# Patient Record
Sex: Female | Born: 1945
Health system: Southern US, Community
[De-identification: ages and names within clinical notes are randomized; demographics above are authoritative.]

## PROBLEM LIST (undated history)

## (undated) DIAGNOSIS — L02619 Cutaneous abscess of unspecified foot: Secondary | ICD-10-CM

## (undated) DIAGNOSIS — I10 Essential (primary) hypertension: Secondary | ICD-10-CM

## (undated) DIAGNOSIS — L600 Ingrowing nail: Secondary | ICD-10-CM

## (undated) DIAGNOSIS — E785 Hyperlipidemia, unspecified: Secondary | ICD-10-CM

## (undated) HISTORY — PX: TONSILLECTOMY: SUR1361

## (undated) HISTORY — PX: CHOLECYSTECTOMY: SHX55

## (undated) HISTORY — DX: Essential (primary) hypertension: I10

## (undated) HISTORY — DX: Hyperlipidemia, unspecified: E78.5

## (undated) HISTORY — PX: BREAST BIOPSY: SHX20

## (undated) HISTORY — PX: OTHER SURGICAL HISTORY: SHX169

---
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
## 2017-04-02 ENCOUNTER — Other Ambulatory Visit: Payer: Self-pay | Admitting: Physician Assistant

## 2017-04-02 DIAGNOSIS — Z1231 Encounter for screening mammogram for malignant neoplasm of breast: Secondary | ICD-10-CM

## 2017-04-10 ENCOUNTER — Ambulatory Visit
Admission: RE | Admit: 2017-04-10 | Discharge: 2017-04-10 | Disposition: A | Discharge status: Home / Self Care | Payer: Medicare Other | Source: Ambulatory Visit | Attending: Physician Assistant | Admitting: Physician Assistant

## 2017-04-10 DIAGNOSIS — Z1231 Encounter for screening mammogram for malignant neoplasm of breast: Secondary | ICD-10-CM

## 2017-04-14 ENCOUNTER — Other Ambulatory Visit: Payer: Self-pay | Admitting: Physician Assistant

## 2017-04-14 DIAGNOSIS — R928 Other abnormal and inconclusive findings on diagnostic imaging of breast: Secondary | ICD-10-CM

## 2017-04-16 ENCOUNTER — Ambulatory Visit: Payer: Medicare Other

## 2017-04-16 ENCOUNTER — Ambulatory Visit
Admission: RE | Admit: 2017-04-16 | Discharge: 2017-04-16 | Disposition: A | Discharge status: Home / Self Care | Payer: Medicare Other | Source: Ambulatory Visit | Attending: Physician Assistant | Admitting: Physician Assistant

## 2017-04-16 DIAGNOSIS — R928 Other abnormal and inconclusive findings on diagnostic imaging of breast: Secondary | ICD-10-CM

## 2017-06-05 ENCOUNTER — Encounter (INDEPENDENT_AMBULATORY_CARE_PROVIDER_SITE_OTHER): Payer: Self-pay

## 2018-03-15 ENCOUNTER — Other Ambulatory Visit: Payer: Self-pay | Admitting: Family Medicine

## 2018-03-15 DIAGNOSIS — Z1231 Encounter for screening mammogram for malignant neoplasm of breast: Secondary | ICD-10-CM

## 2018-04-26 ENCOUNTER — Ambulatory Visit
Admission: RE | Admit: 2018-04-26 | Discharge: 2018-04-26 | Disposition: A | Discharge status: Home / Self Care | Payer: Medicare Other | Source: Ambulatory Visit | Attending: Family Medicine | Admitting: Family Medicine

## 2018-04-26 DIAGNOSIS — Z1231 Encounter for screening mammogram for malignant neoplasm of breast: Secondary | ICD-10-CM

## 2019-05-18 ENCOUNTER — Other Ambulatory Visit: Payer: Self-pay

## 2019-05-18 ENCOUNTER — Ambulatory Visit (INDEPENDENT_AMBULATORY_CARE_PROVIDER_SITE_OTHER): Payer: Medicare Other | Admitting: Podiatry

## 2019-05-18 ENCOUNTER — Encounter: Payer: Self-pay | Admitting: Podiatry

## 2019-05-18 VITALS — BP 144/71 | HR 52

## 2019-05-18 DIAGNOSIS — L02619 Cutaneous abscess of unspecified foot: Secondary | ICD-10-CM

## 2019-05-18 DIAGNOSIS — B351 Tinea unguium: Secondary | ICD-10-CM

## 2019-05-18 DIAGNOSIS — L600 Ingrowing nail: Secondary | ICD-10-CM | POA: Insufficient documentation

## 2019-05-18 DIAGNOSIS — M79674 Pain in right toe(s): Secondary | ICD-10-CM

## 2019-05-18 DIAGNOSIS — L03039 Cellulitis of unspecified toe: Secondary | ICD-10-CM

## 2019-05-18 DIAGNOSIS — L03031 Cellulitis of right toe: Secondary | ICD-10-CM | POA: Diagnosis not present

## 2019-05-18 DIAGNOSIS — M79675 Pain in left toe(s): Secondary | ICD-10-CM | POA: Diagnosis not present

## 2019-05-18 DIAGNOSIS — L02611 Cutaneous abscess of right foot: Secondary | ICD-10-CM

## 2019-05-18 HISTORY — DX: Cellulitis of unspecified toe: L03.039

## 2019-05-18 HISTORY — DX: Ingrowing nail: L60.0

## 2019-05-18 HISTORY — DX: Cutaneous abscess of unspecified foot: L02.619

## 2019-05-18 MED ORDER — AMOXICILLIN 500 MG PO CAPS: 500.0000 mg | ORAL_CAPSULE | Freq: Two times a day (BID) | ORAL | 0 refills | Status: DC

## 2019-05-18 NOTE — Patient Instructions (Addendum)
EPSOM SALT FOOT SOAK INSTRUCTIONS  1.  Place 1/4 cup of epsom salts in 2 quarts of warm tap water. IF YOU ARE DIABETIC, OR HAVE NEUROPATHY,  CHECK THE TEMPERATURE OF THE WATER WITH YOUR ELBOW.  2.  Submerge your foot/feet in the solution and soak for 20 minutes.      3.  Next, remove your foot or feet from solution, blot dry the affected area.    4.  Apply antibiotic ointment and cover with fabric band-aid .  5.  This soak should be done once a day for 10 days.   6.  Monitor for any signs/symptoms of infection such as redness, swelling, odor, drainage, increased pain,  or non-healing of digit.   7.  Please do not hesitate to call the office and speak to a Nurse or Doctor if you have questions.   8.  If you experience fever, chills, nightsweats, nausea or vomiting with worsening of digit, please go to the emergency room.     Ingrown Toenail An ingrown toenail occurs when the corner or sides of a toenail grow into the surrounding skin. This causes discomfort and pain. The big toe is most commonly affected, but any of the toes can be affected. If an ingrown toenail is not treated, it can become infected. What are the causes? This condition may be caused by:  Wearing shoes that are too small or tight.  An injury, such as stubbing your toe or having your toe stepped on.  Improper cutting or care of your toenails.  Having nail or foot abnormalities that were present from birth (congenital abnormalities), such as having a nail that is too big for your toe. What increases the risk? The following factors may make you more likely to develop ingrown toenails:  Age. Nails tend to get thicker with age, so ingrown nails are more common among older people.  Cutting your toenails incorrectly, such as cutting them very short or cutting them unevenly. An ingrown toenail is more likely to get infected if you have:  Diabetes.  Blood flow (circulation) problems. What are the signs or symptoms?  Symptoms of an ingrown toenail may include:  Pain, soreness, or tenderness.  Redness.  Swelling.  Hardening of the skin that surrounds the toenail. Signs that an ingrown toenail may be infected include:  Fluid or pus.  Symptoms that get worse instead of better. How is this diagnosed? An ingrown toenail may be diagnosed based on your medical history, your symptoms, and a physical exam. If you have fluid or blood coming from your toenail, a sample may be collected to test for the specific type of bacteria that is causing the infection. How is this treated? Treatment depends on how severe your ingrown toenail is. You may be able to care for your toenail at home.  If you have an infection, you may be prescribed antibiotic medicines.  If you have fluid or pus draining from your toenail, your health care provider may drain it.  If you have trouble walking, you may be given crutches to use.  If you have a severe or infected ingrown toenail, you may need a procedure to remove part or all of the nail. Follow these instructions at home: Foot care   Do not pick at your toenail or try to remove it yourself.  Soak your foot in warm, soapy water. Do this for 20 minutes, 3 times a day, or as often as told by your health care provider. This helps to   keep your toe clean and keep your skin soft.  Wear shoes that fit well and are not too tight. Your health care provider may recommend that you wear open-toed shoes while you heal.  Trim your toenails regularly and carefully. Cut your toenails straight across to prevent injury to the skin at the corners of the toenail. Do not cut your nails in a curved shape.  Keep your feet clean and dry to help prevent infection. Medicines  Take over-the-counter and prescription medicines only as told by your health care provider.  If you were prescribed an antibiotic, take it as told by your health care provider. Do not stop taking the antibiotic even if you  start to feel better. Activity  Return to your normal activities as told by your health care provider. Ask your health care provider what activities are safe for you.  Avoid activities that cause pain. General instructions  If your health care provider told you to use crutches to help you move around, use them as instructed.  Keep all follow-up visits as told by your health care provider. This is important. Contact a health care provider if:  You have more redness, swelling, pain, or other symptoms that do not improve with treatment.  You have fluid, blood, or pus coming from your toenail. Get help right away if:  You have a red streak on your skin that starts at your foot and spreads up your leg.  You have a fever. Summary  An ingrown toenail occurs when the corner or sides of a toenail grow into the surrounding skin. This causes discomfort and pain. The big toe is most commonly affected, but any of the toes can be affected.  If an ingrown toenail is not treated, it can become infected.  Fluid or pus draining from your toenail is a sign of infection. Your health care provider may need to drain it. You may be given antibiotics to treat the infection.  Trimming your toenails regularly and properly can help you prevent an ingrown toenail. This information is not intended to replace advice given to you by your health care provider. Make sure you discuss any questions you have with your health care provider. Document Released: 08/15/2000 Document Revised: 12/10/2018 Document Reviewed: 05/06/2017 Elsevier Patient Education  2020 [employer] Inc[employer]  

## 2019-05-26 ENCOUNTER — Encounter: Payer: Self-pay | Admitting: Podiatry

## 2019-05-26 NOTE — Progress Notes (Signed)
Subjective: Amber Goodwin presents today with cc of painful right great toe for the past 10 days. She suspects it is an ingrown toenail. She has not been able to wear enclosed shoe gear due to pain.     Past Medical History:  Diagnosis Date  . HTN (hypertension)   . Hyperlipidemia    Outpatient Medications Prior to Visit  Medication Sig Dispense Refill  . atorvastatin (LIPITOR) 10 MG tablet     . hydrochlorothiazide (HYDRODIURIL) 25 MG tablet     . hydrocortisone 2.5 % cream apply TO ear(s) 2 TIMES DAILY FOR 2 WEEKS AS NEEDED FOR flares    . ketoconazole (NIZORAL) 2 % cream apply thin layer TO ears EVERY DAY FOR maintenance    . metoprolol succinate (TOPROL-XL) 100 MG 24 hr tablet     . potassium chloride SA (K-DUR) 20 MEQ tablet      No facility-administered medications prior to visit.    No Known Allergies  Past Surgical History:  Procedure Laterality Date  . BREAST BIOPSY    . Bundle tie release right wrist    . CESAREAN SECTION     x 2  . CHOLECYSTECTOMY    . TONSILLECTOMY     Social History   Occupational History  . Not on file  Tobacco Use  . Smoking status: Never Smoker  . Smokeless tobacco: Never Used  Substance and Sexual Activity  . Alcohol use: Yes    Comment: occasionally  . Drug use: Not on file  . Sexual activity: Not on file     Family History  Problem Relation Age of Onset  . Breast cancer Mother   . Breast cancer Maternal Aunt   . Breast cancer Maternal Grandmother      There is no immunization history on file for this patient.   Review of systems: Positive Findings in bold print.  Constitutional:  chills, fatigue, fever, sweats, weight change Communication: Optometrist, sign Ecologist, hand writing, iPad/Android device Head: headaches, head injury Eyes: changes in vision, eye pain, glaucoma, cataracts, macular degeneration, diplopia, glare,  light sensitivity, eyeglasses or contacts, blindness Ears nose mouth throat: hearing  impaired, hearing aids,  ringing in ears, deaf, sign language,  vertigo,   nosebleeds,  rhinitis,  cold sores, snoring, swollen glands Cardiovascular: HTN, edema, arrhythmia, pacemaker in place, defibrillator in place, chest pain/tightness, chronic anticoagulation, blood clot, heart failure, MI Peripheral Vascular: leg cramps, varicose veins, blood clots, lymphedema, varicosities Respiratory:  difficulty breathing, denies congestion, SOB, wheezing, cough, emphysema Gastrointestinal: change in appetite or weight, abdominal pain, constipation, diarrhea, nausea, vomiting, vomiting blood, change in bowel habits, abdominal pain, jaundice, rectal bleeding, hemorrhoids, GERD Genitourinary:  nocturia,  pain on urination, polyuria,  blood in urine, Foley catheter, urinary urgency, ESRD on hemodialysis Musculoskeletal: amputation, cramping, stiff joints, painful joints, decreased joint motion, fractures, OA, gout, hemiplegia, paraplegia, uses cane, wheelchair bound, uses walker, uses rollator Skin: +changes in toenails, color change, dryness, itching, mole changes,  rash, wound(s) Neurological: headaches, numbness in feet, paresthesias in feet, burning in feet, fainting,  seizures, change in speech. denies headaches, memory problems/poor historian, cerebral palsy, weakness, paralysis, CVA, TIA Endocrine: diabetes, hypothyroidism, hyperthyroidism,  goiter, dry mouth, flushing, heat intolerance,  cold intolerance,  excessive thirst, denies polyuria,  nocturia Hematological:  easy bleeding, excessive bleeding, easy bruising, enlarged lymph nodes, on long term blood thinner, history of past transusions Allergy/immunological:  hives, eczema, frequent infections, multiple drug allergies, seasonal allergies, transplant recipient, multiple food allergies Psychiatric:  anxiety,  depression, mood disorder, suicidal ideations, hallucinations, insomnia  Objective: Vitals:   05/18/19 1634  BP: (!) 144/71  Pulse: (!) 52     Vascular Examination: Capillary refill time immediate x 10 digits.  Dorsalis pedis pulses palpable b/l.   Posterior tibial pulses palpable b/l.   No digital hair x 10 digits.  Skin temperature gradient WNL b/l.  Dermatological Examination: Skin with normal turgor, texture and tone b/l.  Incurvated nailplate right great toe medial border(s) with tenderness to palpation. Mild erythema noted along medial border. No purulence expressed.   Toenails 1-5 b/l discolored, thick, dystrophic with subungual debris and pain with palpation to nailbeds due to thickness of nails.  Musculoskeletal: Muscle strength 5/5 to all LE muscle groups  Neurological: Sensation intact 5/5 b/l with 10 gram monofilament.  Vibratory sensation intact b/l.  Assessment: 1. Cellulitis right great toe 2. Ingrown toenail right great toe 3. Painful onychomycosis toenails 1-5 b/l   Plan: 1. Discussed diagnosis and treatment options.  Literature dispensed on today. 2. Offending nail border debrided and curretaged right hallux. Border cleansed with alcohol and triple antibiotic applied. Patient given written instructions for epsom salt soaks once daily for one week. Call office if condition does not resolve.  3. Rx written for amoxicillin 500 mg po bid x 10 days. 4. Toenails 1-5 left, 2-5 right were debrided in length and girth without iatrogenic bleeding. 5. Patient to continue soft, supportive shoe gear daily. 6. Patient to report any pedal injuries to medical professional immediately. 7. Call if condition worsens. Recommended follow up 9-12 weeks. Patient states she will call and schedule follow up appt as she would like to coordinate with her husband's next appointment.  8. Patient/POA to call should there be a concern in the interim.

## 2019-05-31 ENCOUNTER — Ambulatory Visit
Admission: RE | Admit: 2019-05-31 | Discharge: 2019-05-31 | Disposition: A | Discharge status: Home / Self Care | Payer: Medicare Other | Source: Ambulatory Visit | Attending: Family Medicine | Admitting: Family Medicine

## 2019-05-31 ENCOUNTER — Other Ambulatory Visit: Payer: Self-pay | Admitting: Family Medicine

## 2019-05-31 ENCOUNTER — Other Ambulatory Visit: Payer: Self-pay

## 2019-05-31 DIAGNOSIS — Z1231 Encounter for screening mammogram for malignant neoplasm of breast: Secondary | ICD-10-CM

## 2019-08-08 ENCOUNTER — Ambulatory Visit: Payer: Medicare Other | Admitting: Podiatry

## 2019-08-08 ENCOUNTER — Other Ambulatory Visit: Payer: Self-pay

## 2019-08-08 ENCOUNTER — Encounter: Payer: Self-pay | Admitting: Podiatry

## 2019-08-08 DIAGNOSIS — M79674 Pain in right toe(s): Secondary | ICD-10-CM

## 2019-08-08 DIAGNOSIS — M79675 Pain in left toe(s): Secondary | ICD-10-CM

## 2019-08-08 DIAGNOSIS — B351 Tinea unguium: Secondary | ICD-10-CM | POA: Diagnosis not present

## 2019-08-14 NOTE — Progress Notes (Signed)
Subjective:  Amber Goodwin presents to clinic today with cc of  painful, thick, discolored, elongated toenails  of both feet that become tender and patient cannot cut because of thickness. Pain is aggravated when wearing enclosed shoe gear and relieved with periodic professional debridement.  She relates improvement of painful right great toe.She says she is starting to feel it again. Denies any redness, drainage or swelling.  Medications reviewed in chart.  No Known Allergies   Objective:  Physical Examination:  Vascular Examination: Capillary refill time immediate x 10 digits.  Dorsalis pedis present b/l.  Posterior tibial pulses present b/l.  Digital hair absent b/l.  Skin temperature gradient WNL b/l.  Dermatological Examination: Skin with normal turgor, texture and tone b/l.  No open wounds b/l.  No interdigital macerations noted b/l.  Elongated, thick, discolored brittle toenails with subungual debris and pain on dorsal palpation of nailbeds 1-5 b/l.  Incurvated nailplate right great toe medial border with tenderness to palpation. No erythema, no edema, no drainage noted.  Musculoskeletal Examination: Muscle strength 5/5 to all muscle groups b/l.  No pain, crepitus or joint discomfort with active/passive ROM.  Neurological Examination: Sensation intact 5/5 b/l with 10 gram monofilament.  Vibratory sensation intact b/l.  Proprioceptive sensation intact b/l.  Assessment: Mycotic nail infection with pain 1-5 b/l Ingrown nail right hallux, noninfected  Plan: Toenails 1-5 b/l were debrided in length and girth without iatrogenic laceration. Offending nail border debrided and curretaged right hallux. Border cleansed with alcohol and triple antibiotic applied. She was instructed to apply TAO to digit once daily for one week. 2.  Continue soft, supportive shoe gear daily. 3.  Report any pedal injuries to medical professional. 4.  Follow up 9 weeks. 5.   Patient/POA to call should there be a question/concern in there interim.

## 2019-09-24 ENCOUNTER — Ambulatory Visit: Payer: Medicare Other | Attending: Internal Medicine

## 2019-09-24 DIAGNOSIS — Z23 Encounter for immunization: Secondary | ICD-10-CM

## 2019-09-24 NOTE — Progress Notes (Signed)
   Covid-19 Vaccination Clinic  Name:  Amber Goodwin    MRN: [id number] DOB: October 09, 1945  09/24/2019  Amber Goodwin was observed post Covid-19 immunization for 15 minutes without incidence. She was provided with Vaccine Information Sheet and instruction to access the V-Safe system.   Amber Goodwin was instructed to call 911 with any severe reactions post vaccine: Marland Kitchen Difficulty breathing  . Swelling of your face and throat  . A fast heartbeat  . A bad rash all over your body  . Dizziness and weakness    Immunizations Administered    Name Date Dose VIS Date Route   Pfizer COVID-19 Vaccine 09/24/2019  2:17 PM 0.3 mL 08/12/2019 Intramuscular   Manufacturer: [place]   Lot: [id number]   [place]: [id number]

## 2019-10-15 ENCOUNTER — Ambulatory Visit: Payer: Medicare Other | Attending: Internal Medicine

## 2019-10-15 DIAGNOSIS — Z23 Encounter for immunization: Secondary | ICD-10-CM | POA: Insufficient documentation

## 2019-10-15 NOTE — Progress Notes (Signed)
   Covid-19 Vaccination Clinic  Name:  Amber Goodwin    MRN: [id number] DOB: 02/07/1946  10/15/2019  Ms. Hanlin was observed post Covid-19 immunization for 15 minutes without incidence. She was provided with Vaccine Information Sheet and instruction to access the V-Safe system.   Ms. Gaetz was instructed to call 911 with any severe reactions post vaccine: Marland Kitchen Difficulty breathing  . Swelling of your face and throat  . A fast heartbeat  . A bad rash all over your body  . Dizziness and weakness    Immunizations Administered    Name Date Dose VIS Date Route   Pfizer COVID-19 Vaccine 10/15/2019  1:04 PM 0.3 mL 08/12/2019 Intramuscular   Manufacturer: [place]   Lot: [id number]   [place]: [id number]

## 2019-11-08 ENCOUNTER — Other Ambulatory Visit: Payer: Self-pay

## 2019-11-08 ENCOUNTER — Encounter: Payer: Self-pay | Admitting: Podiatry

## 2019-11-08 ENCOUNTER — Ambulatory Visit: Payer: Medicare Other | Admitting: Podiatry

## 2019-11-08 VITALS — Temp 97.3°F

## 2019-11-08 DIAGNOSIS — M79675 Pain in left toe(s): Secondary | ICD-10-CM

## 2019-11-08 DIAGNOSIS — B351 Tinea unguium: Secondary | ICD-10-CM

## 2019-11-08 DIAGNOSIS — M79674 Pain in right toe(s): Secondary | ICD-10-CM

## 2019-11-08 NOTE — Patient Instructions (Signed)

## 2019-11-14 NOTE — Progress Notes (Signed)
Subjective: Amber Goodwin presents today for follow up of follow up painful ingrown toenails b/l great toes and painful mycotic nails b/l that are difficult to trim. Pain interferes with ambulation. Aggravating factors include wearing enclosed shoe gear. Pain is relieved with periodic professional debridement.   She voices no new pedal problems on today's visit.  No Known Allergies   Objective: Vitals:   11/08/19 1419  Temp: (!) 97.3 F (36.3 C)    Pt 74 y.o. year old Caucasian female, obese,  in NAD. AAO x 3.   Vascular Examination:  Capillary refill time to digits immediate b/l. Palpable DP pulses b/l. Palpable PT pulses b/l. Pedal hair absent b/l Skin temperature gradient within normal limits b/l.  Dermatological Examination: Pedal skin with normal turgor, texture and tone bilaterally. No open wounds bilaterally. Toenails 1-5 b/l elongated, dystrophic, thickened, crumbly with subungual debris and tenderness to dorsal palpation. Incurvated nailplate right great toe medial border. No erythema, no edema, no drainage, no flocculence.  Musculoskeletal: Normal muscle strength 5/5 to all lower extremity muscle groups bilaterally, no gross bony deformities bilaterally and no pain crepitus or joint limitation noted with ROM b/l  Neurological: Protective sensation intact 5/5 intact bilaterally with 10g monofilament b/l  Assessment: 1. Pain due to onychomycosis of toenails of both feet    Plan: -Toenails 1-5 b/l were debrided in length and girth with sterile nail nippers and dremel without iatrogenic bleeding.  -Patient to continue soft, supportive shoe gear daily. -Patient to report any pedal injuries to medical professional immediately. -Offending nail border debrided and curretaged right hallux. Border cleansed with alcohol and triple antibiotic applied. No further treatment required by patient/caregiver. -Patient/POA to call should there be question/concern in the interim.  Return  in about 3 months (around 02/08/2020) for nail trim.

## 2020-01-11 ENCOUNTER — Ambulatory Visit: Payer: Medicare Other | Admitting: Podiatry

## 2020-01-11 ENCOUNTER — Encounter: Payer: Self-pay | Admitting: Podiatry

## 2020-01-11 DIAGNOSIS — B351 Tinea unguium: Secondary | ICD-10-CM | POA: Diagnosis not present

## 2020-01-11 DIAGNOSIS — M79674 Pain in right toe(s): Secondary | ICD-10-CM

## 2020-01-11 DIAGNOSIS — M79675 Pain in left toe(s): Secondary | ICD-10-CM | POA: Diagnosis not present

## 2020-01-11 NOTE — Patient Instructions (Signed)

## 2020-01-19 NOTE — Progress Notes (Signed)
Subjective: Amber Goodwin is a 74 y.o. female patient seen today follow up painful ingrown toenails b/l great toes and painful mycotic nails b/l that are difficult to trim. Pain interferes with ambulation. Aggravating factors include wearing enclosed shoe gear. Pain is relieved with periodic professional debridement.  She voices no new medical problems.  Patient Active Problem List   Diagnosis Date Noted  . Ingrown nail of great toe of right foot 05/18/2019    Current Outpatient Medications on File Prior to Visit  Medication Sig Dispense Refill  . atorvastatin (LIPITOR) 10 MG tablet     . hydrochlorothiazide (HYDRODIURIL) 25 MG tablet     . ketoconazole (NIZORAL) 2 % cream apply thin layer TO ears EVERY DAY FOR maintenance    . metoprolol succinate (TOPROL-XL) 100 MG 24 hr tablet     . potassium chloride SA (K-DUR) 20 MEQ tablet     . XIIDRA 5 % SOLN     . amoxicillin (AMOXIL) 500 MG capsule Take 1 capsule (500 mg total) by mouth 2 (two) times daily. 20 capsule 0  . hydrocortisone 2.5 % cream apply TO ear(s) 2 TIMES DAILY FOR 2 WEEKS AS NEEDED FOR flares     No current facility-administered medications on file prior to visit.    No Known Allergies  Objective: Physical Exam  General: Amber Goodwin is a pleasant 74 y.o. Caucasian female, in NAD. AAO x 3.   Vascular:  Neurovascular status unchanged b/l. Capillary refill time to digits immediate b/l. Palpable DP pulses b/l. Palpable PT pulses b/l. Pedal hair absent b/l Skin temperature gradient within normal limits b/l.  Dermatological:  Pedal skin with normal turgor, texture and tone bilaterally. No open wounds bilaterally. No interdigital macerations bilaterally. Toenails 1-5 b/l elongated, dystrophic, thickened, crumbly with subungual debris and tenderness to dorsal palpation.  Incurvated nailplate b/l great toes with tenderness to palpation. No erythema, no edema, no drainage noted.  Musculoskeletal:  Normal muscle strength  5/5 to all lower extremity muscle groups bilaterally. No gross bony deformities bilaterally. No pain crepitus or joint limitation noted with ROM b/l.  Neurological:  Protective sensation intact 5/5 intact bilaterally with 10g monofilament b/l. Vibratory sensation intact b/l. Proprioception intact bilaterally.  Assessment and Plan:  1. Pain due to onychomycosis of toenails of both feet   -Examined patient. -No new findings. No new orders. -Toenails 1-5 b/l were debrided in length and girth with sterile nail nippers and dremel without iatrogenic bleeding. Offending nail borders debrided and curretaged b/l great toes. Borders cleansed with alcohol. Antibiotic ointment applied. No further treatment required by patient. -Patient to continue soft, supportive shoe gear daily. -Patient to report any pedal injuries to medical professional immediately. -Patient/POA to call should there be question/concern in the interim.  Return in about 9 weeks (around 03/14/2020) for ingrown toenail right/ nail trim.  Marzetta Board, DPM

## 2020-03-12 ENCOUNTER — Encounter: Payer: Self-pay | Admitting: Podiatry

## 2020-03-12 ENCOUNTER — Telehealth: Payer: Self-pay | Admitting: Podiatry

## 2020-03-12 ENCOUNTER — Other Ambulatory Visit: Payer: Self-pay

## 2020-03-12 ENCOUNTER — Ambulatory Visit: Payer: Medicare Other | Admitting: Podiatry

## 2020-03-12 DIAGNOSIS — M79674 Pain in right toe(s): Secondary | ICD-10-CM | POA: Diagnosis not present

## 2020-03-12 DIAGNOSIS — M79675 Pain in left toe(s): Secondary | ICD-10-CM | POA: Diagnosis not present

## 2020-03-12 DIAGNOSIS — L600 Ingrowing nail: Secondary | ICD-10-CM | POA: Diagnosis not present

## 2020-03-12 DIAGNOSIS — B351 Tinea unguium: Secondary | ICD-10-CM

## 2020-03-12 MED ORDER — NEOMYCIN-POLYMYXIN-HC 3.5-10000-1 OT SOLN: OTIC | 0 refills | Status: DC

## 2020-03-12 NOTE — Telephone Encounter (Signed)
pts husband called about pts prescription that was sent in. He said they got the prescription and states it says its an ear solution. I tried to transfer to the nurse put pts husband stated he cannot wait on the nurse to call back within 24 hrs. He then asked me to read the medication that was called in and I did and he confirmed it was the correct name as what was called in. The instructions were correct as well.

## 2020-03-12 NOTE — Patient Instructions (Addendum)
EPSOM SALT FOOT SOAK INSTRUCTIONS  *IF YOU HAVE BEEN PRESCRIBED ANTIBIOTICS, TAKE AS INSTRUCTED UNTIL ALL ARE GONE*  Shopping List:  A. Plain epsom salt (not scented) B. Neosporin Cream/Ointment or Bacitracin Cream/Ointment (or prescribed antiobiotic drops/cream/ointment) C. 1-inch fabric band-aids  1.  Place 1/4 cup of epsom salts in 2 quarts of warm tap water. IF YOU ARE DIABETIC, OR HAVE NEUROPATHY, CHECK THE TEMPERATURE OF THE WATER WITH YOUR ELBOW.  2.  Submerge your foot/feet in the solution and soak for 10-15 minutes.      3.  Next, remove your foot/feet from solution, blot dry the affected area.    4.  Apply light amount of antibiotic drops and cover with fabric band-aid.  5.  This soak should be done once a day for 14 days.   6.  Monitor for any signs/symptoms of infection such as redness, swelling, odor, drainage, increased pain, or non-healing of digit.   7.  Please do not hesitate to call the office and speak to a Nurse or Doctor if you have questions.   8.  If you experience fever, chills, nightsweats, nausea or vomiting with worsening of digit, please go to the emergency room.

## 2020-03-12 NOTE — Progress Notes (Signed)
Subjective:  Amber Goodwin presents to clinic today with cc of painful, ingrown toenail of R hallux.  Pain has been present for several months.  Attempted treatments include: temporary partial nail avulsion and slant back procedure.  Past Medical History:  Diagnosis Date  . Cellulitis and abscess of toe 05/18/2019  . HTN (hypertension)   . Hyperlipidemia   . Ingrown nail of great toe of right foot 05/18/2019    Patient Active Problem List   Diagnosis Date Noted  . Ingrown nail of great toe of right foot 05/18/2019    Past Surgical History:  Procedure Laterality Date  . BREAST BIOPSY    . Bundle tie release right wrist    . CESAREAN SECTION     x 2  . CHOLECYSTECTOMY    . TONSILLECTOMY      Current Outpatient Medications on File Prior to Visit  Medication Sig Dispense Refill  . atorvastatin (LIPITOR) 10 MG tablet     . hydrochlorothiazide (HYDRODIURIL) 25 MG tablet     . ketoconazole (NIZORAL) 2 % cream apply thin layer TO ears EVERY DAY FOR maintenance    . metoprolol succinate (TOPROL-XL) 100 MG 24 hr tablet     . potassium chloride SA (K-DUR) 20 MEQ tablet     . XIIDRA 5 % SOLN      No current facility-administered medications on file prior to visit.     No Known Allergies   Objective: Amber Goodwin is a pleasant 74 y.o. female in NAD. AAO x 3.   Vascular: Capillary refill time to digits immediate b/l. Palpable pedal pulses b/l LE. Pedal hair absent. Lower extremity skin temperature gradient within normal limits. No pain with calf compression b/l.  Dermatological0; Pedal skin with normal turgor, texture and tone bilaterally. No open wounds bilaterally. No interdigital macerations bilaterally. Incurvated nailplate medial border(s) R hallux.  Nail border hypertrophy present. There is tenderness to palpation. Sign(s) of infection: no clinical signs of infection noted on examination today.  R hallux noted to be incurvated with tenderness to palpation.  Clinical  signs noted today: no clinical signs of infection noted on examination today. and pain on palpation.  Toenails 2-5 b/l and left hallux are painful, elongated, discolored, dystrophic with subungual debris. Pain with dorsal palpation of nailplates. No erythema, no edema, no drainage noted.  Musculoskeletal Normal muscle strength 5/5 to all lower extremity muscle groups bilaterally. No pain crepitus or joint limitation noted with ROM b/l. No gross bony deformities bilaterally.  Neurological Protective sensation intact 5/5 intact bilaterally with 10g monofilament b/l. Vibratory sensation intact b/l. Proprioception intact bilaterally.  Assessment: 1. Ingrown nail of great toe of right foot   2. Pain in right toe(s)   3. Pain due to onychomycosis of toenails of both feet    Plan: 1. Discussed diagnosis of chronic ingrown toenail medial right hallux with treatment options. 2. Patient/POA agreed to have permanent partia nail avulsion of R hallux medial border. 3. Consent form signed and in chart. 4. Prepped R hallux with chlorhexidine/betadine. 5. Local injection of  3 cc's 50/50 mix of 2% xylocaine plain and 0.5% marcaine plain administered via hallux block to R hallux. Offending nail border/plate freed utilizing elevator. Offending nail border removed with hemostat. Three applications of phenol applied to medial border right hallux.  Digit cleansed with alcohol. Light bleeding controlled with Lumicain Hemostatic Solution. Topical antibiotic and light dressng applied to digit. 6. Patient tolerated local anesthesia and procedure well with no complications. 7.  Discused post-procedure instructions of epsom salt warm water soaks and dispensed written handout. 8. Rx written for Corticosporin Otic Solution to be applied to R hallux once daily after soaks. 9. Patient to continue soft, supportive shoe gear daily. 10. Patient/POA to call should there be question/concern in the interim. 11. Patient/POA  related understanding of post-procedure instructions. Toenails 2-5 b/l and left hallux debrided in length and girth without iatrogenic laceration.  Return in about 2 weeks (around 03/26/2020) for right nail check.  Marzetta Board, DPM

## 2020-03-26 ENCOUNTER — Other Ambulatory Visit: Payer: Self-pay

## 2020-03-26 ENCOUNTER — Ambulatory Visit (INDEPENDENT_AMBULATORY_CARE_PROVIDER_SITE_OTHER): Payer: Medicare Other | Admitting: Podiatry

## 2020-03-26 DIAGNOSIS — Z9889 Other specified postprocedural states: Secondary | ICD-10-CM

## 2020-03-27 ENCOUNTER — Encounter: Payer: Self-pay | Admitting: Podiatry

## 2020-03-27 NOTE — Progress Notes (Signed)
Subjective:  Amber Goodwin presents today s/p temporary total nail avulsion R hallux.  Patient states she has been following post procedure instructions. She relates no pain on today's visit. Denies any fever, chills, night sweats, nausea or vomiting.  Her husband is present during today's visit. He has been assisting Amber Goodwin with her soaks.They would like clariifcation of prescription of Corticosporin Solution.  Objective: There were no vitals filed for this visit.  74 y.o. female in NAD. AAO X 3.  Neurovascular status unchanged b/l lower extremities. Capillary refill time to digits immediate b/l. Palpable pedal pulses b/l LE. Pedal hair sparse. Lower extremity skin temperature gradient within normal limits.  Pedal skin with normal turgor, texture and tone bilaterally. Procedure site of R hallux noted to be healing as expected with no erythema, no edema, no drainage, no purulence.  Normal muscle strength 5/5 to all lower extremity muscle groups bilaterally. No pain crepitus or joint limitation noted with ROM b/l. No gross bony deformities bilaterally. Patient ambulates independent of any assistive aids.  Protective sensation intact 5/5 intact bilaterally with 10g monofilament b/l. Vibratory sensation intact b/l. Proprioception intact bilaterally.  Assessment: Status post permanent partial nail avulsion medial border right hallux, doing well  Plan: -Examined patient. -Gently cleansed operative site with sterile currette. Lavaged border with alcohol and applied light dressing -Instructed her to continue soaks for the next 3 days.  -Patient to report any pedal injuries to medical professional immediately. -Patient to continue soft, supportive shoe gear daily. -Patient/POA to call should there be question/concern in the interim.  Return in about 7 weeks (around 05/14/2020) for nail trim.

## 2020-05-14 ENCOUNTER — Encounter: Payer: Self-pay | Admitting: Podiatry

## 2020-05-14 ENCOUNTER — Ambulatory Visit (INDEPENDENT_AMBULATORY_CARE_PROVIDER_SITE_OTHER): Payer: Medicare Other | Admitting: Podiatry

## 2020-05-14 ENCOUNTER — Other Ambulatory Visit: Payer: Self-pay

## 2020-05-14 DIAGNOSIS — M79674 Pain in right toe(s): Secondary | ICD-10-CM | POA: Diagnosis not present

## 2020-05-14 DIAGNOSIS — M79675 Pain in left toe(s): Secondary | ICD-10-CM | POA: Diagnosis not present

## 2020-05-14 DIAGNOSIS — B351 Tinea unguium: Secondary | ICD-10-CM | POA: Diagnosis not present

## 2020-05-15 NOTE — Progress Notes (Signed)
Subjective: Amber Goodwin presents today for follow up of follow up painful ingrown toenails b/l great toes and painful mycotic nails b/l that are difficult to trim. Pain interferes with ambulation. Aggravating factors include wearing enclosed shoe gear. Pain is relieved with periodic professional debridement.   She is s/p partial matrixectomy of medial border of the right hallux. She denies any pain. States there is a piece of skin sticking up from the procedure site and she did not cut it since she was coming in today.   She and her husband will be traveling to [place] to help a friend pick up a new car. They will also be going to dinner in [place].  No Known Allergies   Objective: There were no vitals filed for this visit.  Amber Goodwin is a  73 y.o. year old Caucasian female, obese,  in NAD. AAO x 3.   Vascular Examination:  Capillary refill time to digits immediate b/l. Palpable DP pulses b/l. Palpable PT pulses b/l. Pedal hair absent b/l Skin temperature gradient within normal limits b/l.  Dermatological Examination: Pedal skin with normal turgor, texture and tone bilaterally. No open wounds bilaterally. Toenails 1-5 b/l elongated, dystrophic, thickened, crumbly with subungual debris and tenderness to dorsal palpation.   Procedure site right hallux completely healed/epithtelialized. No erythema, no edema, no drainage. Small area of dried serosanguinous drainage on medial border.  Small area of dry skin medial 1st met head right foot.  Musculoskeletal: Normal muscle strength 5/5 to all lower extremity muscle groups bilaterally, no gross bony deformities bilaterally and no pain crepitus or joint limitation noted with ROM b/l  Neurological: Protective sensation intact 5/5 intact bilaterally with 10g monofilament b/l  Assessment: 1. Pain due to onychomycosis of toenails of both feet    Plan: -Toenails 1-5 b/l were debrided in length and girth with sterile nail nippers and  dremel without iatrogenic bleeding. Procedure site cleaned. It is completely healed. -Small iatrogenic laceration sustained with attempted removal of dry skin. Silver nitrate and band-aid applied. Instructed her to apply Neosporin to area once daily for one week. She related understanding. -Patient to continue soft, supportive shoe gear daily. -Patient to report any pedal injuries to medical professional immediately. -Offending nail border debrided and curretaged right hallux. Border cleansed with alcohol and triple antibiotic applied. No further treatment required by patient/caregiver. -Patient/POA to call should there be question/concern in the interim.  Return in about 9 weeks (around 07/16/2020). 

## 2020-05-22 ENCOUNTER — Other Ambulatory Visit: Payer: Self-pay | Admitting: Family Medicine

## 2020-05-22 DIAGNOSIS — Z1231 Encounter for screening mammogram for malignant neoplasm of breast: Secondary | ICD-10-CM

## 2020-06-15 ENCOUNTER — Ambulatory Visit: Payer: Medicare Other

## 2020-07-16 ENCOUNTER — Ambulatory Visit: Payer: Medicare Other | Admitting: Podiatry

## 2020-07-24 ENCOUNTER — Other Ambulatory Visit: Payer: Self-pay

## 2020-07-24 ENCOUNTER — Encounter: Payer: Self-pay | Admitting: Podiatrist

## 2020-07-24 ENCOUNTER — Ambulatory Visit: Payer: Medicare Other | Admitting: Podiatrist

## 2020-07-24 DIAGNOSIS — M79674 Pain in right toe(s): Secondary | ICD-10-CM

## 2020-07-24 DIAGNOSIS — I739 Peripheral vascular disease, unspecified: Secondary | ICD-10-CM | POA: Diagnosis not present

## 2020-07-24 DIAGNOSIS — M79675 Pain in left toe(s): Secondary | ICD-10-CM | POA: Diagnosis not present

## 2020-07-24 DIAGNOSIS — B351 Tinea unguium: Secondary | ICD-10-CM | POA: Diagnosis not present

## 2020-07-24 NOTE — Progress Notes (Signed)
  Chief Complaint  Patient presents with  . Nail Problem    Nail trim 1-5 bilateral     HPI: Patient is 74 y.o. female who presents today for the concerns as listed above. She has been getting her nails professionally trimmed due to them growing thick and becoming painful when she walks and wears enclosed shoes.  Pain is reduced with periodic professional debridement.   Patient Active Problem List   Diagnosis Date Noted  . Ingrown nail of great toe of right foot 05/18/2019    Current Outpatient Medications on File Prior to Visit  Medication Sig Dispense Refill  . amoxicillin-clavulanate (AUGMENTIN) 875-125 MG tablet Take 1 tablet by mouth 2 (two) times daily.    Marland Kitchen atorvastatin (LIPITOR) 10 MG tablet     . hydrochlorothiazide (HYDRODIURIL) 25 MG tablet     . metoprolol succinate (TOPROL-XL) 100 MG 24 hr tablet     . potassium chloride SA (K-DUR) 20 MEQ tablet     . XIIDRA 5 % SOLN     . ketoconazole (NIZORAL) 2 % cream apply thin layer TO ears EVERY DAY FOR maintenance     No current facility-administered medications on file prior to visit.    No Known Allergies  Review of Systems No fevers, chills, nausea, muscle aches, no difficulty breathing, no calf pain, no chest pain or shortness of breath.   Physical Exam  GENERAL APPEARANCE: Alert, conversant. Appropriately groomed. No acute distress.   VASCULAR: Pedal pulses palpable DP bilateral.  DP pulse is absent bilateral.  Proximal to distal cooling it warm to warm. Absent pedal hair growth noted.   NEUROLOGIC: sensation is intact to 5.07 monofilament at 5/5 sites bilateral.  Light touch is intact bilateral, vibratory sensation intact bilateral  MUSCULOSKELETAL: acceptable muscle strength, tone and stability bilateral.  No gross boney pedal deformities noted.    DERMATOLOGIC: skin is warm, supple, and dry.  No open lesions noted.  No rash, no pre ulcerative lesions. Digital nails are thick, discolored, dystrophic, brittle with  subungual debris present and clinically mycotic x 10.     Assessment     ICD-10-CM   1. Pain due to onychomycosis of toenails of both feet  B35.1    M79.675    M79.674      Plan  Debridement of toenails was recommended.  Onychoreduction of symptomatic toenails was performed via nail nipper and power burr without iatrogenic incident.  Patient was instructed on signs and symptoms of infection and was told to call immediately should any of these arise.   She will return in 3 months to see Dr. Adah Perl and will call sooner if any concerns arise.

## 2020-08-06 ENCOUNTER — Other Ambulatory Visit: Payer: Self-pay

## 2020-08-06 ENCOUNTER — Ambulatory Visit
Admission: RE | Admit: 2020-08-06 | Discharge: 2020-08-06 | Disposition: A | Discharge status: Home / Self Care | Payer: Medicare Other | Source: Ambulatory Visit | Attending: Family Medicine | Admitting: Family Medicine

## 2020-08-06 DIAGNOSIS — Z1231 Encounter for screening mammogram for malignant neoplasm of breast: Secondary | ICD-10-CM

## 2020-08-10 ENCOUNTER — Ambulatory Visit: Payer: Medicare Other | Admitting: Podiatry

## 2020-09-13 DIAGNOSIS — L300 Nummular dermatitis: Secondary | ICD-10-CM | POA: Diagnosis not present

## 2020-09-17 ENCOUNTER — Ambulatory Visit: Payer: Medicare Other | Admitting: Podiatry

## 2020-09-25 DIAGNOSIS — E785 Hyperlipidemia, unspecified: Secondary | ICD-10-CM | POA: Diagnosis not present

## 2020-09-25 DIAGNOSIS — Z0000 Encounter for general adult medical examination without abnormal findings: Secondary | ICD-10-CM | POA: Diagnosis not present

## 2020-09-26 ENCOUNTER — Other Ambulatory Visit: Payer: Self-pay

## 2020-09-26 ENCOUNTER — Encounter: Payer: Self-pay | Admitting: Podiatry

## 2020-09-26 ENCOUNTER — Ambulatory Visit: Payer: Medicare Other | Admitting: Podiatry

## 2020-09-26 DIAGNOSIS — M79675 Pain in left toe(s): Secondary | ICD-10-CM | POA: Diagnosis not present

## 2020-09-26 DIAGNOSIS — B351 Tinea unguium: Secondary | ICD-10-CM

## 2020-09-26 DIAGNOSIS — M79674 Pain in right toe(s): Secondary | ICD-10-CM | POA: Diagnosis not present

## 2020-09-30 DIAGNOSIS — I10 Essential (primary) hypertension: Secondary | ICD-10-CM | POA: Insufficient documentation

## 2020-09-30 DIAGNOSIS — Z800 Family history of malignant neoplasm of digestive organs: Secondary | ICD-10-CM | POA: Insufficient documentation

## 2020-09-30 DIAGNOSIS — Z860100 Personal history of colon polyps, unspecified: Secondary | ICD-10-CM | POA: Insufficient documentation

## 2020-09-30 DIAGNOSIS — M8580 Other specified disorders of bone density and structure, unspecified site: Secondary | ICD-10-CM | POA: Insufficient documentation

## 2020-09-30 DIAGNOSIS — E785 Hyperlipidemia, unspecified: Secondary | ICD-10-CM | POA: Insufficient documentation

## 2020-09-30 DIAGNOSIS — R7301 Impaired fasting glucose: Secondary | ICD-10-CM | POA: Insufficient documentation

## 2020-09-30 DIAGNOSIS — Z85828 Personal history of other malignant neoplasm of skin: Secondary | ICD-10-CM | POA: Insufficient documentation

## 2020-09-30 NOTE — Progress Notes (Signed)
Subjective: Amber Goodwin presents today for follow up of follow up painful ingrown toenails b/l great toes and painful mycotic nails b/l that are difficult to trim. Pain interferes with ambulation. Aggravating factors include wearing enclosed shoe gear. Pain is relieved with periodic professional debridement.   She voices no new pedal problems on today's visit.  Allergies  Allergen Reactions  . Lisinopril     Other reaction(s): Cough     Objective: There were no vitals filed for this visit.  Amber Goodwin is a  75 y.o. year old Caucasian female, obese,  in NAD. AAO x 3.   Vascular Examination:  Capillary refill time to digits immediate b/l. Palpable DP pulses b/l. Palpable PT pulses b/l. Pedal hair absent b/l Skin temperature gradient within normal limits b/l.  Dermatological Examination: Pedal skin with normal turgor, texture and tone bilaterally. No open wounds bilaterally. Toenails 1-5 b/l elongated, dystrophic, thickened, crumbly with subungual debris and tenderness to dorsal palpation.   Procedure site right hallux completely healed/epithtelialized. Small piece of skin noted medial aspect   Musculoskeletal: Normal muscle strength 5/5 to all lower extremity muscle groups bilaterally, no gross bony deformities bilaterally and no pain crepitus or joint limitation noted with ROM b/l  Neurological: Protective sensation intact 5/5 intact bilaterally with 10g monofilament b/l  Assessment: 1. Pain due to onychomycosis of toenails of both feet    Plan: -Toenails 1-5 b/l were debrided in length and girth with sterile nail nippers and dremel without iatrogenic bleeding.  -Patient to continue soft, supportive shoe gear daily. -Patient to report any pedal injuries to medical professional immediately. -Patient/POA to call should there be question/concern in the interim.  Return in about 9 weeks (around 11/28/2020).

## 2020-10-16 ENCOUNTER — Ambulatory Visit: Payer: Medicare Other | Admitting: Podiatry

## 2020-10-24 DIAGNOSIS — M8580 Other specified disorders of bone density and structure, unspecified site: Secondary | ICD-10-CM | POA: Diagnosis not present

## 2020-10-24 DIAGNOSIS — I10 Essential (primary) hypertension: Secondary | ICD-10-CM | POA: Diagnosis not present

## 2020-10-24 DIAGNOSIS — E785 Hyperlipidemia, unspecified: Secondary | ICD-10-CM | POA: Diagnosis not present

## 2020-10-26 ENCOUNTER — Ambulatory Visit: Payer: Medicare Other

## 2020-11-16 ENCOUNTER — Other Ambulatory Visit: Payer: Self-pay | Admitting: Physician Assistant

## 2020-11-16 DIAGNOSIS — R1319 Other dysphagia: Secondary | ICD-10-CM

## 2020-11-16 DIAGNOSIS — K5900 Constipation, unspecified: Secondary | ICD-10-CM | POA: Diagnosis not present

## 2020-11-23 ENCOUNTER — Ambulatory Visit
Admission: RE | Admit: 2020-11-23 | Discharge: 2020-11-23 | Disposition: A | Discharge status: Home / Self Care | Payer: Medicare Other | Source: Ambulatory Visit | Attending: Physician Assistant | Admitting: Physician Assistant

## 2020-11-23 DIAGNOSIS — R1319 Other dysphagia: Secondary | ICD-10-CM

## 2020-11-23 DIAGNOSIS — R1310 Dysphagia, unspecified: Secondary | ICD-10-CM | POA: Diagnosis not present

## 2020-11-26 ENCOUNTER — Encounter: Payer: Self-pay | Admitting: Podiatry

## 2020-11-26 ENCOUNTER — Ambulatory Visit: Payer: Medicare Other | Admitting: Podiatry

## 2020-11-26 ENCOUNTER — Other Ambulatory Visit: Payer: Self-pay

## 2020-11-26 DIAGNOSIS — M79674 Pain in right toe(s): Secondary | ICD-10-CM | POA: Diagnosis not present

## 2020-11-26 DIAGNOSIS — K5900 Constipation, unspecified: Secondary | ICD-10-CM | POA: Insufficient documentation

## 2020-11-26 DIAGNOSIS — B351 Tinea unguium: Secondary | ICD-10-CM

## 2020-11-26 DIAGNOSIS — M79675 Pain in left toe(s): Secondary | ICD-10-CM

## 2020-11-29 NOTE — Progress Notes (Signed)
Subjective: Amber Goodwin presents today for follow up of painful thick toenails that are difficult to trim. Pain interferes with ambulation. Aggravating factors include wearing enclosed shoe gear. Pain is relieved with periodic professional debridement.   She voices no new pedal problems on today's visit.  Allergies  Allergen Reactions  . Lisinopril     Other reaction(s): Cough     Objective: There were no vitals filed for this visit.  Amber Goodwin is a  75 y.o. year old Caucasian female, obese,  in NAD. AAO x 3.   Vascular Examination:  Capillary refill time to digits immediate b/l. Palpable DP pulses b/l. Palpable PT pulses b/l. Pedal hair absent b/l Skin temperature gradient within normal limits b/l.  Dermatological Examination: Pedal skin with normal turgor, texture and tone bilaterally. No open wounds bilaterally. Toenails 1-5 b/l elongated, dystrophic, thickened, crumbly with subungual debris and tenderness to dorsal palpation.   Musculoskeletal: Normal muscle strength 5/5 to all lower extremity muscle groups bilaterally, no gross bony deformities bilaterally and no pain crepitus or joint limitation noted with ROM b/l  Neurological: Protective sensation intact 5/5 intact bilaterally with 10g monofilament b/l  Assessment: 1. Pain due to onychomycosis of toenails of both feet    Plan: -No new orders. No new findings. -Toenails 1-5 b/l were debrided in length and girth with sterile nail nippers and dremel without iatrogenic bleeding.  -Patient to continue soft, supportive shoe gear daily. -Patient to report any pedal injuries to medical professional immediately. -Patient/POA to call should there be question/concern in the interim.  Return in about 9 weeks (around 01/28/2021).

## 2020-12-28 ENCOUNTER — Other Ambulatory Visit (HOSPITAL_BASED_OUTPATIENT_CLINIC_OR_DEPARTMENT_OTHER): Payer: Self-pay

## 2020-12-28 ENCOUNTER — Other Ambulatory Visit: Payer: Self-pay

## 2020-12-28 ENCOUNTER — Ambulatory Visit: Payer: Medicare Other | Attending: Internal Medicine

## 2020-12-28 DIAGNOSIS — Z23 Encounter for immunization: Secondary | ICD-10-CM

## 2020-12-28 MED ORDER — PFIZER-BIONT COVID-19 VAC-TRIS 30 MCG/0.3ML IM SUSP
INTRAMUSCULAR | 0 refills | Status: DC
  Filled 2020-12-28: qty 0.3, 1d supply, fill #0

## 2020-12-28 NOTE — Progress Notes (Signed)
   Covid-19 Vaccination Clinic  Name:  Amber Goodwin    MRN: [id number] DOB: [birth year]  12/28/2020  [name] was observed post Covid-19 immunization for 15 minutes without incident. She was provided with Vaccine Information Sheet and instruction to access the V-Safe system.   [name] was instructed to call 911 with any severe reactions post vaccine: Marland Kitchen Difficulty breathing  . Swelling of face and throat  . A fast heartbeat  . A bad rash all over body  . Dizziness and weakness   Immunizations Administered    Name Date Dose VIS Date Route   PFIZER Comrnaty(Gray TOP) Covid-19 Vaccine 12/28/2020  9:20 AM 0.3 mL 08/09/2020 Intramuscular   Manufacturer: [employer], [employer]   Lot: [id number]   NDC: [phone]

## 2021-01-30 ENCOUNTER — Encounter: Payer: Self-pay | Admitting: Podiatry

## 2021-01-30 ENCOUNTER — Other Ambulatory Visit: Payer: Self-pay

## 2021-01-30 ENCOUNTER — Ambulatory Visit: Payer: Medicare Other | Admitting: Podiatry

## 2021-01-30 DIAGNOSIS — M79675 Pain in left toe(s): Secondary | ICD-10-CM

## 2021-01-30 DIAGNOSIS — M79674 Pain in right toe(s): Secondary | ICD-10-CM | POA: Diagnosis not present

## 2021-01-30 DIAGNOSIS — B351 Tinea unguium: Secondary | ICD-10-CM | POA: Diagnosis not present

## 2021-02-06 NOTE — Progress Notes (Signed)
Subjective: Amber Goodwin is a pleasant 75 y.o. female patient seen today painful thick toenails that are difficult to trim. Pain interferes with ambulation. Aggravating factors include wearing enclosed shoe gear. Pain is relieved with periodic professional debridement.  PCP is Kathyrn Lass, MD. Last visit was: 10/24/2020.  Allergies  Allergen Reactions  . Lisinopril     Other reaction(s): Cough    Objective: Physical Exam  General: Amber Goodwin is a pleasant 75 y.o. Caucasian female, morbidly obese in NAD. AAO x 3.   Vascular:  Neurovascular status unchanged b/l lower extremities. Capillary refill time to digits immediate b/l. Palpable pedal pulses b/l LE. Pedal hair present. Lower extremity skin temperature gradient within normal limits.  Dermatological:  Pedal skin with normal turgor, texture and tone bilaterally. No open wounds bilaterally. No interdigital macerations bilaterally. Toenails 1-5 b/l elongated, discolored, dystrophic, thickened, crumbly with subungual debris and tenderness to dorsal palpation.  Musculoskeletal:  Normal muscle strength 5/5 to all lower extremity muscle groups bilaterally. No pain crepitus or joint limitation noted with ROM b/l. No gross bony deformities bilaterally.  Neurological:  Protective sensation intact 5/5 intact bilaterally with 10g monofilament b/l. Vibratory sensation intact b/l.  Assessment and Plan:  1. Pain due to onychomycosis of toenails of both feet     -Examined patient. -Patient to continue soft, supportive shoe gear daily. -Toenails 1-5 b/l were debrided in length and girth with sterile nail nippers and dremel without iatrogenic bleeding.  -Patient to report any pedal injuries to medical professional immediately. -Patient/POA to call should there be question/concern in the interim.  Return in about 3 months (around 05/02/2021).  Marzetta Board, DPM

## 2021-04-01 ENCOUNTER — Encounter: Payer: Self-pay | Admitting: Podiatry

## 2021-04-01 ENCOUNTER — Ambulatory Visit (INDEPENDENT_AMBULATORY_CARE_PROVIDER_SITE_OTHER): Payer: Medicare Other | Admitting: Podiatry

## 2021-04-01 ENCOUNTER — Other Ambulatory Visit: Payer: Self-pay

## 2021-04-01 DIAGNOSIS — M79674 Pain in right toe(s): Secondary | ICD-10-CM | POA: Diagnosis not present

## 2021-04-01 DIAGNOSIS — M79675 Pain in left toe(s): Secondary | ICD-10-CM

## 2021-04-01 DIAGNOSIS — B351 Tinea unguium: Secondary | ICD-10-CM | POA: Diagnosis not present

## 2021-04-02 NOTE — Progress Notes (Signed)
Subjective: Amber Goodwin is a pleasant 75 y.o. female patient seen today painful thick toenails that are difficult to trim. Pain interferes with ambulation. Aggravating factors include wearing enclosed shoe gear. Pain is relieved with periodic professional debridement.  She voices no new pedal problems on today's visit.   PCP is Kathyrn Lass, MD. Last visit was: January, 2022.  Allergies  Allergen Reactions   Lisinopril     Other reaction(s): Cough    Objective: Physical Exam  General: Amber Goodwin is a pleasant 75 y.o. Caucasian female, morbidly obese in NAD. AAO x 3.   Vascular:  Capillary refill time to digits immediate b/l. Palpable DP pulse(s) b/l lower extremities Palpable PT pulse(s) b/l lower extremities Pedal hair present. Lower extremity skin temperature gradient within normal limits.  Dermatological:  Pedal skin with normal turgor, texture and tone b/l lower extremities. No open wounds b/l lower extremities. No interdigital macerations b/l lower extremities. Toenails 1-5 b/l elongated, discolored, dystrophic, thickened, crumbly with subungual debris and tenderness to dorsal palpation.  Musculoskeletal:  Normal muscle strength 5/5 to all lower extremity muscle groups bilaterally. No pain crepitus or joint limitation noted with ROM b/l lower extremities. No gross bony deformities b/l lower extremities. Patient ambulates independent of any assistive aids.  Neurological:  Protective sensation intact 5/5 intact bilaterally with 10g monofilament b/l. Vibratory sensation intact b/l.  Assessment and Plan:  1. Pain due to onychomycosis of toenails of both feet   -No new findings. No new orders. -Patient to continue soft, supportive shoe gear daily. -Toenails 1-5 b/l were debrided in length and girth with sterile nail nippers and dremel without iatrogenic bleeding.  -Patient to report any pedal injuries to medical professional immediately. -Patient/POA to call should there  be question/concern in the interim.  Return in about 9 weeks (around 06/03/2021).  Marzetta Board, DPM

## 2021-04-04 DIAGNOSIS — K224 Dyskinesia of esophagus: Secondary | ICD-10-CM | POA: Diagnosis not present

## 2021-04-04 DIAGNOSIS — R1310 Dysphagia, unspecified: Secondary | ICD-10-CM | POA: Diagnosis not present

## 2021-04-04 DIAGNOSIS — K635 Polyp of colon: Secondary | ICD-10-CM | POA: Diagnosis not present

## 2021-04-04 DIAGNOSIS — K2950 Unspecified chronic gastritis without bleeding: Secondary | ICD-10-CM | POA: Diagnosis not present

## 2021-04-04 DIAGNOSIS — Z800 Family history of malignant neoplasm of digestive organs: Secondary | ICD-10-CM | POA: Diagnosis not present

## 2021-04-04 DIAGNOSIS — Z86010 Personal history of colonic polyps: Secondary | ICD-10-CM | POA: Diagnosis not present

## 2021-04-04 DIAGNOSIS — K449 Diaphragmatic hernia without obstruction or gangrene: Secondary | ICD-10-CM | POA: Diagnosis not present

## 2021-04-04 DIAGNOSIS — R1013 Epigastric pain: Secondary | ICD-10-CM | POA: Diagnosis not present

## 2021-04-04 DIAGNOSIS — D122 Benign neoplasm of ascending colon: Secondary | ICD-10-CM | POA: Diagnosis not present

## 2021-04-09 DIAGNOSIS — D122 Benign neoplasm of ascending colon: Secondary | ICD-10-CM | POA: Diagnosis not present

## 2021-04-09 DIAGNOSIS — K635 Polyp of colon: Secondary | ICD-10-CM | POA: Diagnosis not present

## 2021-06-03 ENCOUNTER — Encounter: Payer: Self-pay | Admitting: Podiatry

## 2021-06-03 ENCOUNTER — Ambulatory Visit: Payer: Medicare Other | Admitting: Podiatry

## 2021-06-03 ENCOUNTER — Other Ambulatory Visit: Payer: Self-pay

## 2021-06-03 DIAGNOSIS — M79674 Pain in right toe(s): Secondary | ICD-10-CM | POA: Diagnosis not present

## 2021-06-03 DIAGNOSIS — M79675 Pain in left toe(s): Secondary | ICD-10-CM

## 2021-06-03 DIAGNOSIS — B351 Tinea unguium: Secondary | ICD-10-CM

## 2021-06-08 NOTE — Progress Notes (Signed)
  Subjective:  Patient ID: Amber Goodwin, female    DOB: 1945/12/27,  MRN: [id number]  JERICHO CIESLIK presents to clinic today for thick, elongated toenails b/l feet which are tender when wearing enclosed shoe gear.  She notes no new pedal problems on today's visit.  PCP is Kathyrn Lass, MD , and last visit was 10/24/2020.  Allergies  Allergen Reactions   Lisinopril     Other reaction(s): Cough    Review of Systems: Negative except as noted in the HPI. Objective:   Constitutional KYLEIGHA MARKERT is a pleasant 75 y.o. Caucasian female, morbidly obese in NAD. AAO x 3.   Vascular Capillary refill time to digits immediate b/l. Palpable pedal pulses b/l LE. Pedal hair present. Lower extremity skin temperature gradient within normal limits. No edema noted b/l lower extremities. No cyanosis or clubbing noted.  Neurologic Normal speech. Oriented to person, place, and time. Protective sensation intact 5/5 intact bilaterally with 10g monofilament b/l. Vibratory sensation intact b/l.  Dermatologic Skin warm and supple b/l lower extremities. No open wounds b/l lower extremities. No interdigital macerations b/l lower extremities. Toenails 1-5 b/l elongated, discolored, dystrophic, thickened, crumbly with subungual debris and tenderness to dorsal palpation.  Orthopedic: Normal muscle strength 5/5 to all lower extremity muscle groups bilaterally. No gross bony deformities b/l lower extremities.   Radiographs: None Assessment:   1. Pain due to onychomycosis of toenails of both feet    Plan:  Patient was evaluated and treated and all questions answered. Consent given for treatment as described below: -Examined patient. -Patient to continue soft, supportive shoe gear daily. -Toenails 1-5 b/l were debrided in length and girth with sterile nail nippers and dremel without iatrogenic bleeding.  -Patient to report any pedal injuries to medical professional immediately. -Patient/POA to call should  there be question/concern in the interim.  Return in about 9 weeks (around 08/05/2021).  Marzetta Board, DPM

## 2021-06-25 ENCOUNTER — Other Ambulatory Visit: Payer: Self-pay | Admitting: Family Medicine

## 2021-06-25 DIAGNOSIS — Z1231 Encounter for screening mammogram for malignant neoplasm of breast: Secondary | ICD-10-CM

## 2021-07-30 DIAGNOSIS — H04123 Dry eye syndrome of bilateral lacrimal glands: Secondary | ICD-10-CM | POA: Diagnosis not present

## 2021-07-30 DIAGNOSIS — H52203 Unspecified astigmatism, bilateral: Secondary | ICD-10-CM | POA: Diagnosis not present

## 2021-07-30 DIAGNOSIS — D3130 Benign neoplasm of unspecified choroid: Secondary | ICD-10-CM | POA: Diagnosis not present

## 2021-08-05 ENCOUNTER — Other Ambulatory Visit: Payer: Self-pay

## 2021-08-05 ENCOUNTER — Encounter: Payer: Self-pay | Admitting: Podiatry

## 2021-08-05 ENCOUNTER — Ambulatory Visit: Payer: Medicare Other | Admitting: Podiatry

## 2021-08-05 DIAGNOSIS — M79674 Pain in right toe(s): Secondary | ICD-10-CM

## 2021-08-05 DIAGNOSIS — B351 Tinea unguium: Secondary | ICD-10-CM | POA: Diagnosis not present

## 2021-08-05 DIAGNOSIS — K2950 Unspecified chronic gastritis without bleeding: Secondary | ICD-10-CM | POA: Insufficient documentation

## 2021-08-05 DIAGNOSIS — K224 Dyskinesia of esophagus: Secondary | ICD-10-CM | POA: Insufficient documentation

## 2021-08-05 DIAGNOSIS — R1310 Dysphagia, unspecified: Secondary | ICD-10-CM | POA: Insufficient documentation

## 2021-08-05 DIAGNOSIS — M79675 Pain in left toe(s): Secondary | ICD-10-CM | POA: Diagnosis not present

## 2021-08-08 ENCOUNTER — Ambulatory Visit
Admission: RE | Admit: 2021-08-08 | Discharge: 2021-08-08 | Disposition: A | Discharge status: Home / Self Care | Payer: Medicare Other | Source: Ambulatory Visit

## 2021-08-08 DIAGNOSIS — Z1231 Encounter for screening mammogram for malignant neoplasm of breast: Secondary | ICD-10-CM

## 2021-08-11 NOTE — Progress Notes (Signed)
Subjective: Amber Goodwin is a 75 y.o. female patient seen today for follow up of  painful thick toenails that are difficult to trim. Pain interferes with ambulation. Aggravating factors include wearing enclosed shoe gear. Pain is relieved with periodic professional debridement.  She voices no new pedal problems on today's visit.  New problems reported today: None.  PCP is Kathyrn Lass, MD. Last visit was: 10/24/2020.  Allergies  Allergen Reactions   Lisinopril     Other reaction(s): Cough    Objective: Physical Exam  General: Patient is a pleasant 75 y.o. Caucasian female in NAD. AAO x 3.   Neurovascular Examination: CFT immediate b/l LE. Palpable DP/PT pulses b/l LE. Digital hair present b/l. Skin temperature gradient WNL b/l. No pain with calf compression b/l. No edema noted b/l. No cyanosis or clubbing noted b/l LE.  Protective sensation intact 5/5 intact bilaterally with 10g monofilament b/l. Vibratory sensation intact b/l.  Dermatological:  Pedal integument with normal turgor, texture and tone b/l LE. No open wounds b/l. No interdigital macerations b/l. Toenails 1-5 b/l elongated, thickened, discolored with subungual debris. +Tenderness with dorsal palpation of nailplates. No hyperkeratotic or porokeratotic lesions present.  Musculoskeletal:  Normal muscle strength 5/5 to all lower extremity muscle groups bilaterally. No pain, crepitus or joint limitation noted with ROM b/l LE. No gross bony pedal deformities b/l. Patient ambulates independently without assistive aids.  Assessment: 1. Pain due to onychomycosis of toenails of both feet    Plan: Patient was evaluated and treated and all questions answered. Consent given for treatment as described below: -Examined patient. -Patient to continue soft, supportive shoe gear daily. -Mycotic toenails 1-5 bilaterally were debrided in length and girth with sterile nail nippers and dremel without incident. -Patient/POA to call  should there be question/concern in the interim.  Return in about 3 months (around 11/03/2021).  Marzetta Board, DPM

## 2021-10-01 DIAGNOSIS — Z0000 Encounter for general adult medical examination without abnormal findings: Secondary | ICD-10-CM | POA: Diagnosis not present

## 2021-10-07 ENCOUNTER — Other Ambulatory Visit: Payer: Self-pay

## 2021-10-07 ENCOUNTER — Ambulatory Visit: Payer: Medicare Other | Admitting: Podiatry

## 2021-10-07 ENCOUNTER — Encounter: Payer: Self-pay | Admitting: Podiatry

## 2021-10-07 DIAGNOSIS — B351 Tinea unguium: Secondary | ICD-10-CM

## 2021-10-07 DIAGNOSIS — M79675 Pain in left toe(s): Secondary | ICD-10-CM

## 2021-10-07 DIAGNOSIS — M79674 Pain in right toe(s): Secondary | ICD-10-CM | POA: Diagnosis not present

## 2021-10-09 DIAGNOSIS — D2262 Melanocytic nevi of left upper limb, including shoulder: Secondary | ICD-10-CM | POA: Diagnosis not present

## 2021-10-09 DIAGNOSIS — D225 Melanocytic nevi of trunk: Secondary | ICD-10-CM | POA: Diagnosis not present

## 2021-10-09 DIAGNOSIS — L570 Actinic keratosis: Secondary | ICD-10-CM | POA: Diagnosis not present

## 2021-10-09 DIAGNOSIS — L821 Other seborrheic keratosis: Secondary | ICD-10-CM | POA: Diagnosis not present

## 2021-10-09 DIAGNOSIS — D2261 Melanocytic nevi of right upper limb, including shoulder: Secondary | ICD-10-CM | POA: Diagnosis not present

## 2021-10-09 DIAGNOSIS — L738 Other specified follicular disorders: Secondary | ICD-10-CM | POA: Diagnosis not present

## 2021-10-09 DIAGNOSIS — L918 Other hypertrophic disorders of the skin: Secondary | ICD-10-CM | POA: Diagnosis not present

## 2021-10-09 DIAGNOSIS — L814 Other melanin hyperpigmentation: Secondary | ICD-10-CM | POA: Diagnosis not present

## 2021-10-13 NOTE — Progress Notes (Signed)
°  Subjective:  Patient ID: Amber Goodwin, female    DOB: 22-Jan-1946,  MRN: [id number]  GEARLDINE LOONEY presents to clinic today for painful elongated mycotic toenails 1-5 bilaterally which are tender when wearing enclosed shoe gear. Pain is relieved with periodic professional debridement.  New problem(s): None.   PCP is Kathyrn Lass, MD , and last visit was October 01, 2021.  Allergies  Allergen Reactions   Lisinopril Cough    Other reaction(s): Cough    Review of Systems: Negative except as noted in the HPI. Objective:   Constitutional Amber Goodwin is a pleasant 76 y.o. Caucasian female, in NAD. AAO x 3.   Vascular Capillary refill time to digits immediate b/l. Palpable pedal pulses b/l LE. Pedal hair present. No pain with calf compression b/l. Lower extremity skin temperature gradient within normal limits. No edema noted b/l LE. No cyanosis or clubbing noted b/l LE.  Neurologic Normal speech. Oriented to person, place, and time. Protective sensation intact 5/5 intact bilaterally with 10g monofilament b/l. Vibratory sensation intact b/l. Proprioception intact bilaterally.  Dermatologic Pedal skin warm and supple b/l.  No open wounds b/l. No interdigital macerations. Toenails 1-5 b/l elongated, thickened, discolored with subungual debris. +Tenderness with dorsal palpation of nailplates. No hyperkeratotic nor porokeratotic lesions noted b/l.  Orthopedic: Muscle strength 5/5 to all lower extremity muscle groups bilaterally. No pain, crepitus or joint limitation noted with ROM bilateral LE. No gross bony deformities bilaterally. Patient ambulates independent of any assistive aids.   Radiographs: None  Assessment:   1. Pain due to onychomycosis of toenails of both feet    Plan:  Patient was evaluated and treated and all questions answered. Consent given for treatment as described below: -Toenails 1-5 b/l were debrided in length and girth with sterile nail nippers and dremel without  iatrogenic bleeding.  -Patient/POA to call should there be question/concern in the interim.  Return in about 9 weeks (around 12/09/2021).  Marzetta Board, DPM

## 2021-10-18 DIAGNOSIS — M859 Disorder of bone density and structure, unspecified: Secondary | ICD-10-CM | POA: Diagnosis not present

## 2021-10-18 DIAGNOSIS — I10 Essential (primary) hypertension: Secondary | ICD-10-CM | POA: Diagnosis not present

## 2021-10-18 DIAGNOSIS — E785 Hyperlipidemia, unspecified: Secondary | ICD-10-CM | POA: Diagnosis not present

## 2021-10-29 DIAGNOSIS — I10 Essential (primary) hypertension: Secondary | ICD-10-CM | POA: Diagnosis not present

## 2021-10-29 DIAGNOSIS — E785 Hyperlipidemia, unspecified: Secondary | ICD-10-CM | POA: Diagnosis not present

## 2021-12-10 ENCOUNTER — Ambulatory Visit: Payer: Medicare Other | Admitting: Podiatry

## 2021-12-10 ENCOUNTER — Encounter: Payer: Self-pay | Admitting: Podiatry

## 2021-12-10 DIAGNOSIS — M79675 Pain in left toe(s): Secondary | ICD-10-CM | POA: Diagnosis not present

## 2021-12-10 DIAGNOSIS — M79674 Pain in right toe(s): Secondary | ICD-10-CM

## 2021-12-10 DIAGNOSIS — B351 Tinea unguium: Secondary | ICD-10-CM | POA: Diagnosis not present

## 2021-12-16 NOTE — Progress Notes (Signed)
Subjective: ?Amber Goodwin is a pleasant 76 y.o. female patient seen today for painful elongated mycotic toenails 1-5 bilaterally which are tender when wearing enclosed shoe gear. Pain is relieved with periodic professional debridement.  ? ?PCP is Kathyrn Lass, MD. Last visit was: 10/24/2020 ? ?She notes no new pedal problems on today' visit. ? ?Allergies  ?Allergen Reactions  ? Lisinopril Cough  ?  Other reaction(s): Cough  ? ? ?Objective: ?Physical Exam ? ?General: Amber Goodwin is a pleasant 76 y.o. Caucasian female, in NAD. AAO x 3.  ? ?Vascular:  ?CFT immediate b/l LE. Palpable DP/PT pulses b/l LE. Digital hair present b/l. Skin temperature gradient WNL b/l. No pain with calf compression b/l. No edema noted b/l. No cyanosis or clubbing noted b/l LE.  ? ?Dermatological:  ?Pedal integument with normal turgor, texture and tone b/l LE. No open wounds b/l. No interdigital macerations b/l. Toenails 1-5 b/l elongated, thickened, discolored with subungual debris. +Tenderness with dorsal palpation of nailplates. No hyperkeratotic or porokeratotic lesions present.  ? ?Musculoskeletal:  ?Normal muscle strength 5/5 to all lower extremity muscle groups bilaterally. No pain, crepitus or joint limitation noted with ROM b/l LE. No gross bony pedal deformities b/l. Patient ambulates independently without assistive aids.  ? ?Neurological:  ?Protective sensation intact 5/5 intact bilaterally with 10g monofilament b/l. Vibratory sensation intact b/l. Proprioception intact bilaterally.  ? ?Assessment and Plan:  ?1. Pain due to onychomycosis of toenails of both feet   ?  ?Patient was evaluated and treated and all questions answered. ?Consent given for treatment as described below: ?-Examined patient. ?-No new findings. No new orders. ?-Mycotic toenails 1-5 bilaterally were debrided in length and girth with sterile nail nippers and dremel without incident. ?-Patient/POA to call should there be question/concern in the  interim. ? ?Return in about 9 weeks (around 02/11/2022). ? ?Marzetta Board, DPM ?

## 2022-01-13 DIAGNOSIS — U071 COVID-19: Secondary | ICD-10-CM | POA: Diagnosis not present

## 2022-02-10 ENCOUNTER — Encounter: Payer: Self-pay | Admitting: Podiatry

## 2022-02-10 ENCOUNTER — Ambulatory Visit: Payer: Medicare Other | Admitting: Podiatry

## 2022-02-10 DIAGNOSIS — B351 Tinea unguium: Secondary | ICD-10-CM | POA: Diagnosis not present

## 2022-02-10 DIAGNOSIS — M79674 Pain in right toe(s): Secondary | ICD-10-CM | POA: Diagnosis not present

## 2022-02-10 DIAGNOSIS — M79675 Pain in left toe(s): Secondary | ICD-10-CM | POA: Diagnosis not present

## 2022-02-16 NOTE — Progress Notes (Signed)
    Subjective:  Patient ID: Amber Goodwin, female    DOB: [birth year],  MRN: [id number]  YOSHI VICENCIO presents to clinic today for painful thick toenails that are difficult to trim. Pain interferes with ambulation. Aggravating factors include wearing enclosed shoe gear. Pain is relieved with periodic professional debridement.  New problem(s): None.   PCP is Kathyrn Lass, MD , and last visit was October 29, 2021.  Allergies  Allergen Reactions   Lisinopril Cough    Other reaction(s): Cough    Review of Systems: Negative except as noted in the HPI.  Objective: No changes noted in today's physical examination.  Objective: Physical Exam  General: Amber Goodwin is a pleasant 76 y.o. Caucasian female, in NAD. AAO x 3.   Vascular:  CFT immediate b/l LE. Palpable DP/PT pulses b/l LE. Digital hair present b/l. Skin temperature gradient WNL b/l. No pain with calf compression b/l. No edema noted b/l. No cyanosis or clubbing noted b/l LE.   Dermatological:  Pedal integument with normal turgor, texture and tone b/l LE. No open wounds b/l. No interdigital macerations b/l. Toenails 1-5 b/l elongated, thickened, discolored with subungual debris. +Tenderness with dorsal palpation of nailplates. No hyperkeratotic or porokeratotic lesions present.   Musculoskeletal:  Normal muscle strength 5/5 to all lower extremity muscle groups bilaterally. No pain, crepitus or joint limitation noted with ROM b/l LE. No gross bony pedal deformities b/l. Patient ambulates independently without assistive aids.   Neurological:  Protective sensation intact 5/5 intact bilaterally with 10g monofilament b/l. Vibratory sensation intact b/l. Proprioception intact bilaterally.   Assessment/Plan: 1. Pain due to onychomycosis of toenails of both feet      -Patient was evaluated and treated. All patient's and/or POA's questions/concerns answered on today's visit. -Patient to continue soft, supportive shoe gear  daily. -Toenails 1-5 b/l were debrided in length and girth with sterile nail nippers and dremel without iatrogenic bleeding.  -Patient/POA to call should there be question/concern in the interim.   Return in about 9 weeks (around 04/14/2022).  Marzetta Board, DPM

## 2022-02-28 DIAGNOSIS — S61210A Laceration without foreign body of right index finger without damage to nail, initial encounter: Secondary | ICD-10-CM | POA: Diagnosis not present

## 2022-04-16 ENCOUNTER — Ambulatory Visit: Payer: Medicare Other | Admitting: Podiatry

## 2022-04-16 ENCOUNTER — Encounter: Payer: Self-pay | Admitting: Podiatry

## 2022-04-16 DIAGNOSIS — M79675 Pain in left toe(s): Secondary | ICD-10-CM | POA: Diagnosis not present

## 2022-04-16 DIAGNOSIS — B351 Tinea unguium: Secondary | ICD-10-CM

## 2022-04-16 DIAGNOSIS — M79674 Pain in right toe(s): Secondary | ICD-10-CM

## 2022-04-21 NOTE — Progress Notes (Signed)
  Subjective:  Patient ID: Amber Goodwin, female    DOB: [birth year],  MRN: [id number]  76 y.o. female presents with painful elongated mycotic toenails 1-5 bilaterally which are tender when wearing enclosed shoe gear. Pain is relieved with periodic professional debridement..    New problem(s): None    PCP: Kathyrn Lass, MD and last visit BFX:[id number] 28, 2023.  Review of Systems: Negative except as noted in the HPI.   Allergies  Allergen Reactions   Lisinopril Cough    Other reaction(s): Cough    Objective:  There were no vitals filed for this visit. Constitutional Patient is a pleasant 76 y.o. Caucasian female morbidly obese in NAD. AAO x 3.  Vascular Capillary fill time to digits immediate b/l.  DP/PT pulse(s) are palpable b/l lower extremities. Pedal hair sparse. Lower extremity skin temperature gradient within normal limits. No pain with calf compression b/l. No edema noted b/l lower extremities. No cyanosis or clubbing noted.   Neurologic Protective sensation intact 5/5 intact bilaterally with 10g monofilament b/l. Vibratory sensation intact b/l. No clonus b/l.   Dermatologic Pedal skin is warm and supple b/l.  No open wounds b/l lower extremities. No interdigital macerations b/l lower extremities. Toenails 1-5 b/l elongated, discolored, dystrophic, thickened, crumbly with subungual debris and tenderness to dorsal palpation. No hyperkeratotic nor porokeratotic lesions present on today's visit.  Orthopedic: Normal muscle strength 5/5 to all lower extremity muscle groups bilaterally. Patient ambulates independent of any assistive aids. No gross bony deformities bilaterally.    Assessment:   1. Pain due to onychomycosis of toenails of both feet    Plan:  Patient was evaluated and treated and all questions answered. Consent given for treatment as described below: -Examined patient. -Patient to continue soft, supportive shoe gear daily. -Mycotic toenails 1-5 bilaterally were  debrided in length and girth with sterile nail nippers and dremel without incident. -Patient/POA to call should there be question/concern in the interim.  Return in about 3 months (around 07/17/2022).  Marzetta Board, DPM

## 2022-06-16 ENCOUNTER — Ambulatory Visit: Payer: Medicare Other | Admitting: Podiatry

## 2022-06-16 DIAGNOSIS — M79675 Pain in left toe(s): Secondary | ICD-10-CM

## 2022-06-16 DIAGNOSIS — B351 Tinea unguium: Secondary | ICD-10-CM

## 2022-06-16 DIAGNOSIS — M79674 Pain in right toe(s): Secondary | ICD-10-CM

## 2022-06-16 NOTE — Progress Notes (Unsigned)
  Subjective:  Patient ID: Amber Goodwin, female    DOB: 19-Jul-1946,  MRN: [id number]  Amber Goodwin presents to clinic today for:  Chief Complaint  Patient presents with   Nail Problem    Routine foot care PCP-Lisa Miller PCP VST1/2022   New problem(s): None.   PCP is Kathyrn Lass, MD.  Allergies  Allergen Reactions   Lisinopril Cough    Other reaction(s): Cough   Review of Systems: Negative except as noted in the HPI.  Objective: No changes noted in today's physical examination.  Amber Goodwin is a pleasant 76 y.o. female {[id number]} AAO x 3. Vascular Capillary fill time to digits immediate b/l.  DP/PT pulse(s) are palpable b/l lower extremities. Pedal hair sparse. Lower extremity skin temperature gradient within normal limits. No pain with calf compression b/l. No edema noted b/l lower extremities. No cyanosis or clubbing noted.   Neurologic Protective sensation intact 5/5 intact bilaterally with 10g monofilament b/l. Vibratory sensation intact b/l. No clonus b/l.   Dermatologic Pedal skin is warm and supple b/l.  No open wounds b/l lower extremities. No interdigital macerations b/l lower extremities. Toenails 1-5 b/l elongated, discolored, dystrophic, thickened, crumbly with subungual debris and tenderness to dorsal palpation. No hyperkeratotic nor porokeratotic lesions present on today's visit.  Orthopedic: Normal muscle strength 5/5 to all lower extremity muscle groups bilaterally. Patient ambulates independent of any assistive aids. No gross bony deformities bilaterally.   Assessment/Plan: 1. Pain due to onychomycosis of toenails of both feet     No orders of the defined types were placed in this encounter.   -Consent given for treatment as described below: -Examined patient. -Mycotic toenails 1-5 bilaterally were debrided in length and girth with sterile nail nippers and dremel without incident. -Patient/POA to call should there be question/concern in the  interim.   Return in about 9 weeks (around 08/18/2022).  Marzetta Board, DPM

## 2022-06-19 ENCOUNTER — Encounter: Payer: Self-pay | Admitting: Podiatry

## 2022-06-26 ENCOUNTER — Other Ambulatory Visit: Payer: Self-pay | Admitting: Family Medicine

## 2022-06-26 DIAGNOSIS — Z1231 Encounter for screening mammogram for malignant neoplasm of breast: Secondary | ICD-10-CM

## 2022-08-05 DIAGNOSIS — H52203 Unspecified astigmatism, bilateral: Secondary | ICD-10-CM | POA: Diagnosis not present

## 2022-08-05 DIAGNOSIS — H43813 Vitreous degeneration, bilateral: Secondary | ICD-10-CM | POA: Diagnosis not present

## 2022-08-05 DIAGNOSIS — H524 Presbyopia: Secondary | ICD-10-CM | POA: Diagnosis not present

## 2022-08-05 DIAGNOSIS — H04123 Dry eye syndrome of bilateral lacrimal glands: Secondary | ICD-10-CM | POA: Diagnosis not present

## 2022-08-18 ENCOUNTER — Ambulatory Visit: Payer: Medicare Other | Admitting: Podiatry

## 2022-08-20 DIAGNOSIS — Z1231 Encounter for screening mammogram for malignant neoplasm of breast: Secondary | ICD-10-CM

## 2022-10-12 DIAGNOSIS — J0100 Acute maxillary sinusitis, unspecified: Secondary | ICD-10-CM | POA: Diagnosis not present

## 2022-10-12 DIAGNOSIS — H1089 Other conjunctivitis: Secondary | ICD-10-CM | POA: Diagnosis not present

## 2022-10-14 ENCOUNTER — Ambulatory Visit
Admission: RE | Admit: 2022-10-14 | Discharge: 2022-10-14 | Disposition: A | Discharge status: Home / Self Care | Payer: Medicare Other | Source: Ambulatory Visit | Attending: Family Medicine | Admitting: Family Medicine

## 2022-10-14 DIAGNOSIS — Z1231 Encounter for screening mammogram for malignant neoplasm of breast: Secondary | ICD-10-CM | POA: Diagnosis not present

## 2022-10-21 ENCOUNTER — Encounter: Payer: Self-pay | Admitting: Podiatry

## 2022-10-21 ENCOUNTER — Ambulatory Visit: Payer: Medicare Other | Admitting: Podiatry

## 2022-10-21 VITALS — BP 169/55

## 2022-10-21 DIAGNOSIS — M79674 Pain in right toe(s): Secondary | ICD-10-CM | POA: Diagnosis not present

## 2022-10-21 DIAGNOSIS — B351 Tinea unguium: Secondary | ICD-10-CM

## 2022-10-21 DIAGNOSIS — M79675 Pain in left toe(s): Secondary | ICD-10-CM

## 2022-10-21 NOTE — Progress Notes (Signed)
  Subjective:  Patient ID: Amber Goodwin, female    DOB: [birth year],  MRN: [id number]  Amber Goodwin presents to clinic today for painful elongated mycotic toenails 1-5 bilaterally which are tender when wearing enclosed shoe gear. Pain is relieved with periodic professional debridement.  Chief Complaint  Patient presents with   Nail Problem    RFC PCP-Miller, Lisa PCP VST- 1year ago   New problem(s): None.   PCP is Kathyrn Lass, MD.  Allergies  Allergen Reactions   Lisinopril Cough    Other reaction(s): Cough   Review of Systems: Negative except as noted in the HPI.  Objective: No changes noted in today's physical examination. Vitals:   10/21/22 1331  BP: (!) 169/55   Amber Goodwin is a pleasant 77 y.o. female obese in NAD. AAO x 3.  Vascular Capillary fill time to digits immediate b/l.  DP/PT pulse(s) are palpable b/l lower extremities. Pedal hair sparse. Lower extremity skin temperature gradient within normal limits. No pain with calf compression b/l. No edema noted b/l lower extremities. No cyanosis or clubbing noted.   Neurologic Protective sensation intact 5/5 intact bilaterally with 10g monofilament b/l. Vibratory sensation intact b/l. No clonus b/l.   Dermatologic Pedal skin is warm and supple b/l.  No open wounds b/l lower extremities. No interdigital macerations b/l lower extremities. Toenails 1-5 b/l elongated, discolored, dystrophic, thickened, crumbly with subungual debris and tenderness to dorsal palpation. No hyperkeratotic nor porokeratotic lesions present on today's visit.  Orthopedic: Normal muscle strength 5/5 to all lower extremity muscle groups bilaterally. Patient ambulates independent of any assistive aids. No gross bony deformities bilaterally.   Assessment/Plan: 1. Pain due to onychomycosis of toenails of both feet   -Patient was evaluated and treated. All patient's and/or POA's questions/concerns answered on today's visit. -Examined  patient. -Continue supportive shoe gear daily. -Mycotic toenails 1-5 bilaterally were debrided in length and girth with sterile nail nippers and dremel without incident. -Patient/POA to call should there be question/concern in the interim.   Return in about 9 weeks (around 12/23/2022).  Marzetta Board, DPM

## 2022-10-27 DIAGNOSIS — J069 Acute upper respiratory infection, unspecified: Secondary | ICD-10-CM | POA: Diagnosis not present

## 2022-10-27 DIAGNOSIS — Z0000 Encounter for general adult medical examination without abnormal findings: Secondary | ICD-10-CM | POA: Diagnosis not present

## 2022-10-31 DIAGNOSIS — R5383 Other fatigue: Secondary | ICD-10-CM | POA: Diagnosis not present

## 2022-10-31 DIAGNOSIS — I10 Essential (primary) hypertension: Secondary | ICD-10-CM | POA: Diagnosis not present

## 2022-10-31 DIAGNOSIS — H1031 Unspecified acute conjunctivitis, right eye: Secondary | ICD-10-CM | POA: Diagnosis not present

## 2022-10-31 DIAGNOSIS — E785 Hyperlipidemia, unspecified: Secondary | ICD-10-CM | POA: Diagnosis not present

## 2022-11-06 DIAGNOSIS — D2272 Melanocytic nevi of left lower limb, including hip: Secondary | ICD-10-CM | POA: Diagnosis not present

## 2022-11-06 DIAGNOSIS — L720 Epidermal cyst: Secondary | ICD-10-CM | POA: Diagnosis not present

## 2022-11-06 DIAGNOSIS — Z85828 Personal history of other malignant neoplasm of skin: Secondary | ICD-10-CM | POA: Diagnosis not present

## 2022-11-06 DIAGNOSIS — L821 Other seborrheic keratosis: Secondary | ICD-10-CM | POA: Diagnosis not present

## 2022-11-06 DIAGNOSIS — D2262 Melanocytic nevi of left upper limb, including shoulder: Secondary | ICD-10-CM | POA: Diagnosis not present

## 2022-11-06 DIAGNOSIS — D1801 Hemangioma of skin and subcutaneous tissue: Secondary | ICD-10-CM | POA: Diagnosis not present

## 2022-11-06 DIAGNOSIS — L814 Other melanin hyperpigmentation: Secondary | ICD-10-CM | POA: Diagnosis not present

## 2022-12-23 ENCOUNTER — Encounter: Payer: Self-pay | Admitting: Podiatry

## 2022-12-23 ENCOUNTER — Ambulatory Visit: Payer: Medicare Other | Admitting: Podiatry

## 2022-12-23 VITALS — BP 173/57

## 2022-12-23 DIAGNOSIS — M79674 Pain in right toe(s): Secondary | ICD-10-CM

## 2022-12-23 DIAGNOSIS — B351 Tinea unguium: Secondary | ICD-10-CM | POA: Diagnosis not present

## 2022-12-23 DIAGNOSIS — M79675 Pain in left toe(s): Secondary | ICD-10-CM | POA: Diagnosis not present

## 2022-12-23 NOTE — Progress Notes (Signed)
  Subjective:  Patient ID: Amber Goodwin, female    DOB: 1946-07-06,  MRN: [id number]  77 y.o. female presents today for painful thick toenails that are difficult to trim. Pain interferes with ambulation. Aggravating factors include wearing enclosed shoe gear. Pain is relieved with periodic professional debridement. Chief Complaint  Patient presents with   Nail Problem    Rm 16 RFC bilateral nail trim.    New problem(s): None   PCP is Sigmund Hazel, MD.  Allergies  Allergen Reactions   Lisinopril Cough    Other reaction(s): Cough    Review of Systems: Negative except as noted in the HPI.   Objective:  Amber Goodwin is a pleasant 77 y.o. female in NAD.Marland Kitchen AAO x 3.  Vascular Examination: Vascular status intact b/l with palpable pedal pulses. CFT immediate b/l. Pedal hair present.No edema. No pain with calf compression b/l. Skin temperature gradient WNL b/l. No varicosities noted.  Neurological Examination: Sensation grossly intact b/l with 10 gram monofilament. Vibratory sensation intact b/l.  Dermatological Examination: Pedal skin with normal turgor, texture and tone b/l. No open wounds nor interdigital macerations noted. Toenails 1-5 b/l thick, discolored, elongated with subungual debris and pain on dorsal palpation. No hyperkeratotic lesions noted b/l.   Musculoskeletal Examination: Muscle strength 5/5 to b/l LE.  No pain, crepitus noted b/l. No gross pedal deformities. Patient ambulates independently without assistive aids.   Radiographs: None  Assessment:   1. Pain due to onychomycosis of toenails of both feet    Plan:  -Consent given for treatment as described below: -Examined patient. -Patient to continue soft, supportive shoe gear daily. -Toenails 1-5 b/l were debrided in length and girth with sterile nail nippers and dremel without iatrogenic bleeding.  -Patient/POA to call should there be question/concern in the interim.  Return in about 9 weeks (around  02/24/2023).  Freddie Breech, DPM

## 2023-02-24 ENCOUNTER — Encounter: Payer: Self-pay | Admitting: Podiatry

## 2023-02-24 ENCOUNTER — Ambulatory Visit: Payer: Medicare Other | Admitting: Podiatry

## 2023-02-24 VITALS — BP 131/69 | HR 45

## 2023-02-24 DIAGNOSIS — M79675 Pain in left toe(s): Secondary | ICD-10-CM

## 2023-02-24 DIAGNOSIS — B353 Tinea pedis: Secondary | ICD-10-CM | POA: Diagnosis not present

## 2023-02-24 DIAGNOSIS — M79674 Pain in right toe(s): Secondary | ICD-10-CM

## 2023-02-24 DIAGNOSIS — B351 Tinea unguium: Secondary | ICD-10-CM

## 2023-03-01 NOTE — Progress Notes (Signed)
  Subjective:  Patient ID: Amber Goodwin, female    DOB: Nov 11, 1945,  MRN: [id number]  77 y.o. female presents to clinic with painful thick toenails that are difficult to trim. Pain interferes with ambulation. Aggravating factors include wearing enclosed shoe gear. Pain is relieved with periodic professional debridement. Chief Complaint  Patient presents with   Nail Problem    "Clip my toenails."    New problem(s): None   PCP is Sigmund Hazel, MD.  Allergies  Allergen Reactions   Lisinopril Cough    Other reaction(s): Cough   Review of Systems: Negative except as noted in the HPI.   Objective:  Amber Goodwin is a pleasant 77 y.o.  in NAD. AAO x 3.  Vascular Examination: Vascular status intact b/l with palpable pedal pulses. CFT immediate b/l. No edema. No pain with calf compression b/l. Skin temperature gradient WNL b/l. No cyanosis or clubbing noted b/l LE.  Neurological Examination: Sensation grossly intact b/l with 10 gram monofilament. Vibratory sensation intact b/l.   Dermatological Examination: Pedal skin with normal turgor, texture and tone b/l. Toenails 1-5 b/l thick, discolored, elongated with subungual debris and pain on dorsal palpation. No hyperkeratotic lesions noted b/l. No open wounds b/l LE. Interdigital maceration noted bilateral 4th webspace(s). No blistering, no weeping, no open wounds.  Musculoskeletal Examination: Normal muscle strength 5/5 to all lower extremity muscle groups bilaterally. No pain, crepitus or joint limitation noted with ROM b/l LE. No gross bony pedal deformities b/l. Patient ambulates independently without assistive aids.  Radiographs: None   Assessment:   1. Pain due to onychomycosis of toenails of both feet   2. Tinea pedis, unspecified laterality    Plan:  -Consent given for treatment as described below: -Examined patient. -Mycotic toenails 1-5 bilaterally were debrided in length and girth with sterile nail nippers and dremel  without incident. -Patient/POA instructed to purchase OTC Lotrimin antifungal spray powder between toes once daily. -Patient/POA to call should there be question/concern in the interim.  Return in about 9 weeks (around 04/28/2023).  Freddie Breech, DPM

## 2023-04-29 ENCOUNTER — Encounter: Payer: Self-pay | Admitting: Podiatry

## 2023-04-29 ENCOUNTER — Ambulatory Visit: Payer: Medicare Other | Admitting: Podiatry

## 2023-04-29 DIAGNOSIS — M79674 Pain in right toe(s): Secondary | ICD-10-CM | POA: Diagnosis not present

## 2023-04-29 DIAGNOSIS — B351 Tinea unguium: Secondary | ICD-10-CM

## 2023-04-29 DIAGNOSIS — M79675 Pain in left toe(s): Secondary | ICD-10-CM | POA: Diagnosis not present

## 2023-04-29 NOTE — Progress Notes (Signed)
  Subjective:  Patient ID: Amber Goodwin, female    DOB: 1945/10/14,  MRN: [id number]  MARLISHA ZIDEK presents to clinic today for painful elongated mycotic toenails 1-5 bilaterally which are tender when wearing enclosed shoe gear. Pain is relieved with periodic professional debridement.  Chief Complaint  Patient presents with   Nail Problem    Rfc,Referring Provider Sigmund Hazel, MD,lov:03/24      New problem(s): None.   PCP is Sigmund Hazel, MD.  Allergies  Allergen Reactions   Lisinopril Cough    Other reaction(s): Cough    Review of Systems: Negative except as noted in the HPI.  Objective:  There were no vitals filed for this visit. Amber Goodwin is a pleasant 77 y.o. female in NAD. AAO x 3.  Vascular Examination: Vascular status intact b/l with palpable pedal pulses. CFT immediate b/l. No edema. No pain with calf compression b/l. Skin temperature gradient WNL b/l. No cyanosis or clubbing noted b/l LE.  Neurological Examination: Sensation grossly intact b/l with 10 gram monofilament. Vibratory sensation intact b/l.   Dermatological Examination: Pedal skin with normal turgor, texture and tone b/l. Toenails 1-5 b/l thick, discolored, elongated with subungual debris and pain on dorsal palpation. No hyperkeratotic lesions noted b/l. No open wounds b/l LE.   Musculoskeletal Examination: Normal muscle strength 5/5 to all lower extremity muscle groups bilaterally. No pain, crepitus or joint limitation noted with ROM b/l LE. No gross bony pedal deformities b/l. Patient ambulates independently without assistive aids.  Radiographs: None  Assessment/Plan: 1. Pain due to onychomycosis of toenails of both feet     -Patient was evaluated and treated. All patient's and/or POA's questions/concerns answered on today's visit. -Patient to continue soft, supportive shoe gear daily. -Toenails 1-5 b/l were debrided in length and girth with sterile nail nippers and dremel without  iatrogenic bleeding.  -Patient/POA to call should there be question/concern in the interim.   Return in about 9 weeks (around 07/01/2023).  Freddie Breech, DPM

## 2023-05-08 IMAGING — MG MM DIGITAL SCREENING BILAT W/ TOMO AND CAD
8 series · 8 of 24 positions shown · non-contrast
Comparison: Previous exam(s).

CLINICAL DATA: Screening.

EXAM:
DIGITAL SCREENING BILATERAL MAMMOGRAM WITH TOMOSYNTHESIS AND CAD
TECHNIQUE: Bilateral screening digital craniocaudal and mediolateral oblique
mammograms were obtained. Bilateral screening digital breast
tomosynthesis was performed. The images were evaluated with
computer-aided detection.

[R MLO synth-2D]
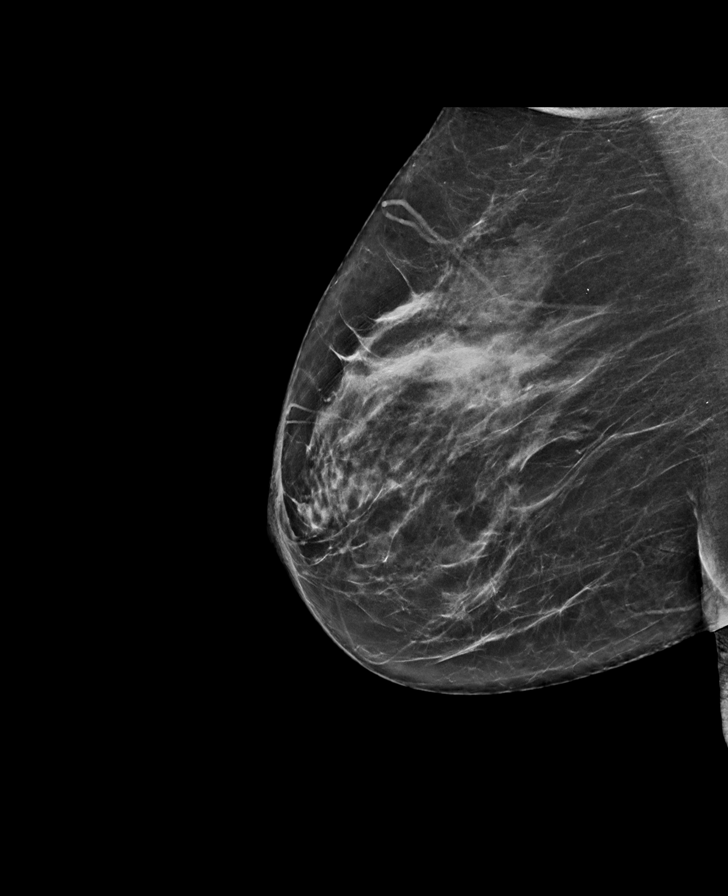

[L CC synth-2D]
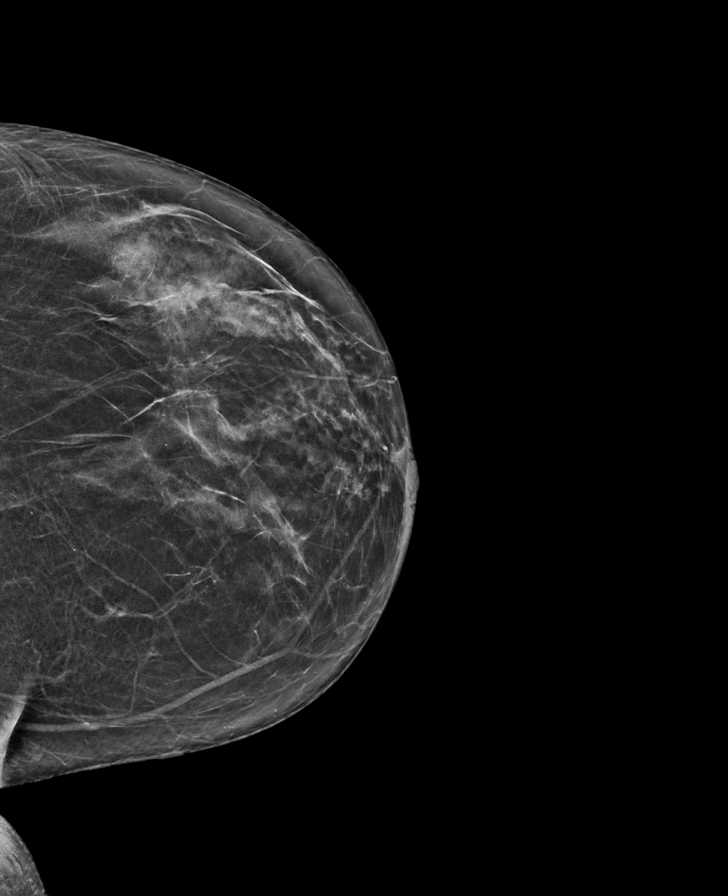

[L MLO synth-2D]
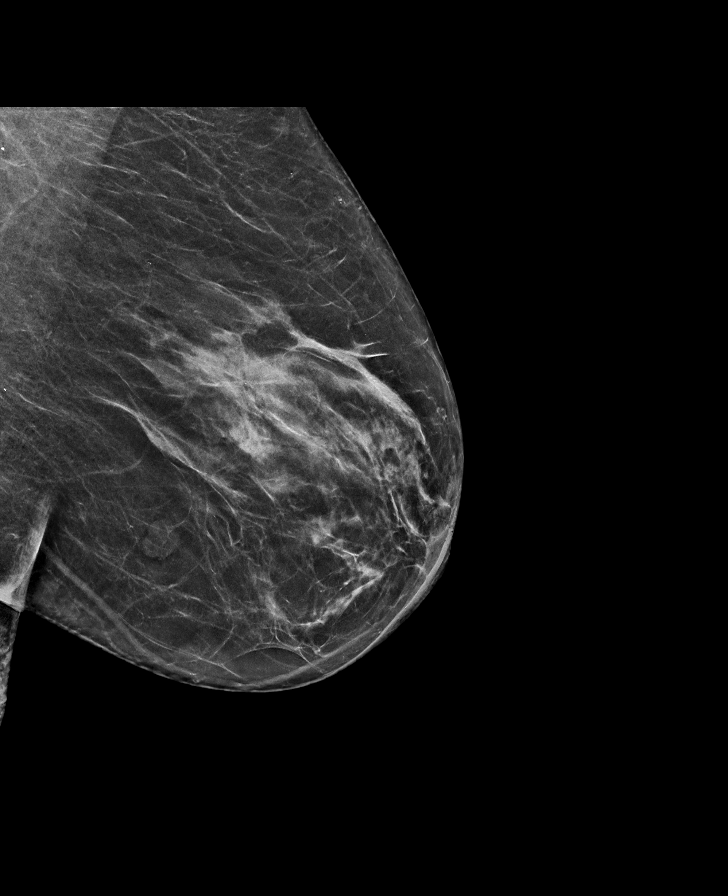

[R CC synth-2D]
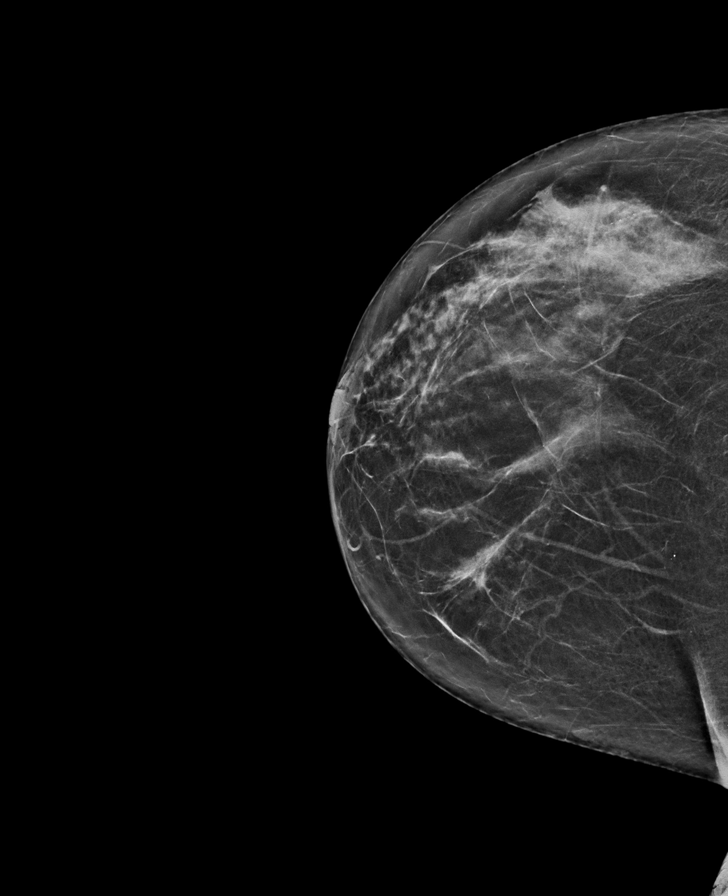

[R CC tomo · tomo slice 33/66.0]
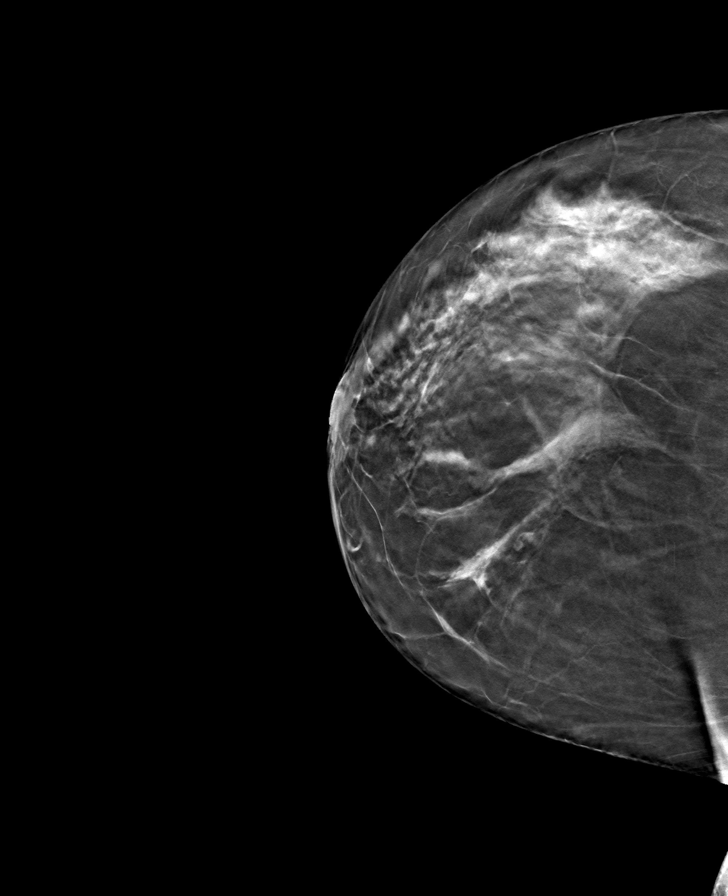

[L MLO tomo · tomo slice 35/70.0]
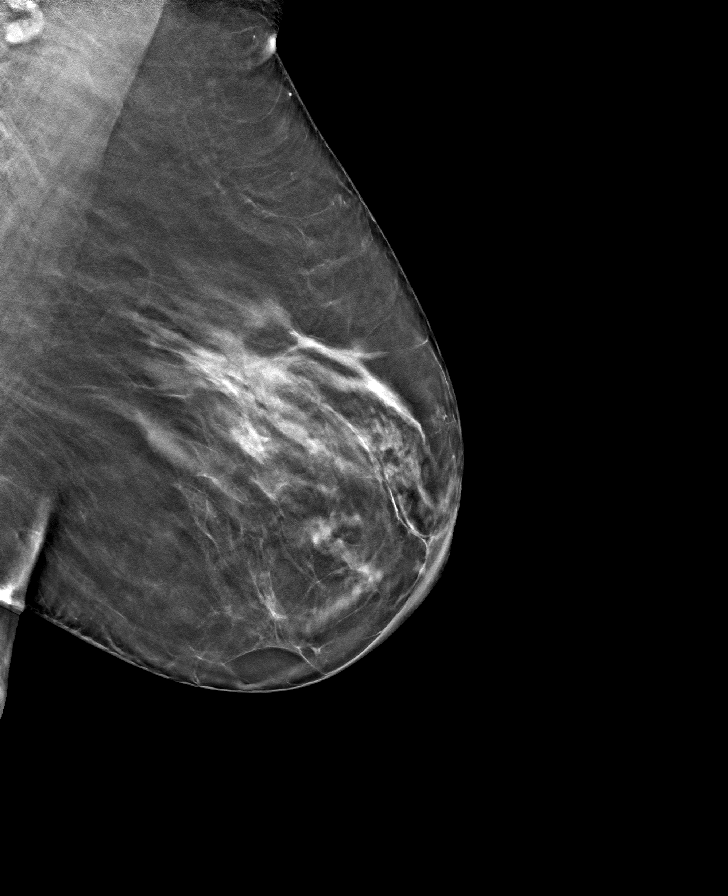

[R MLO tomo · tomo slice 36/71.0]
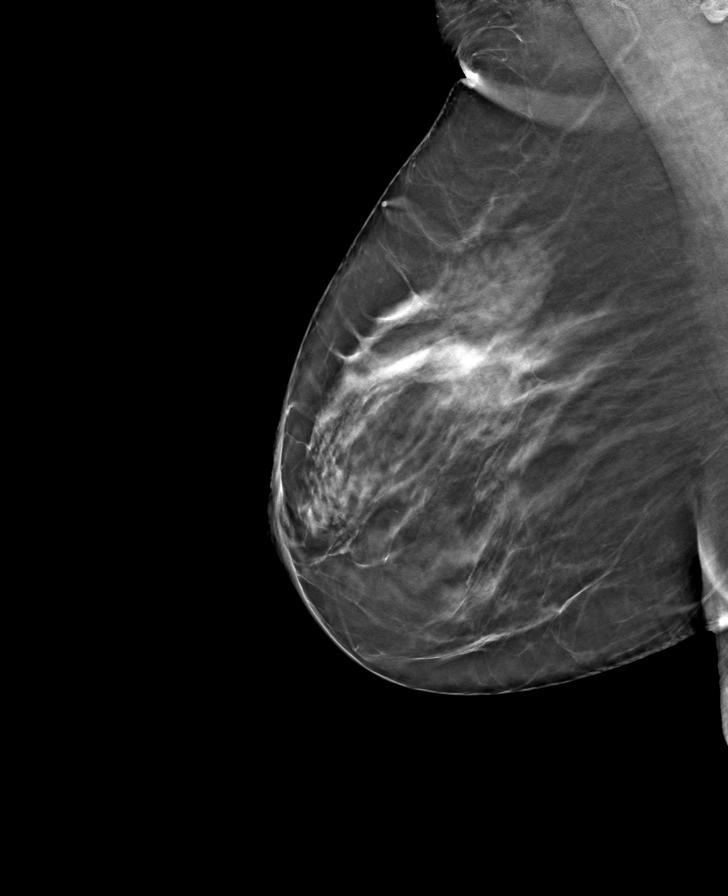

[L CC tomo · tomo slice 31/61.0]
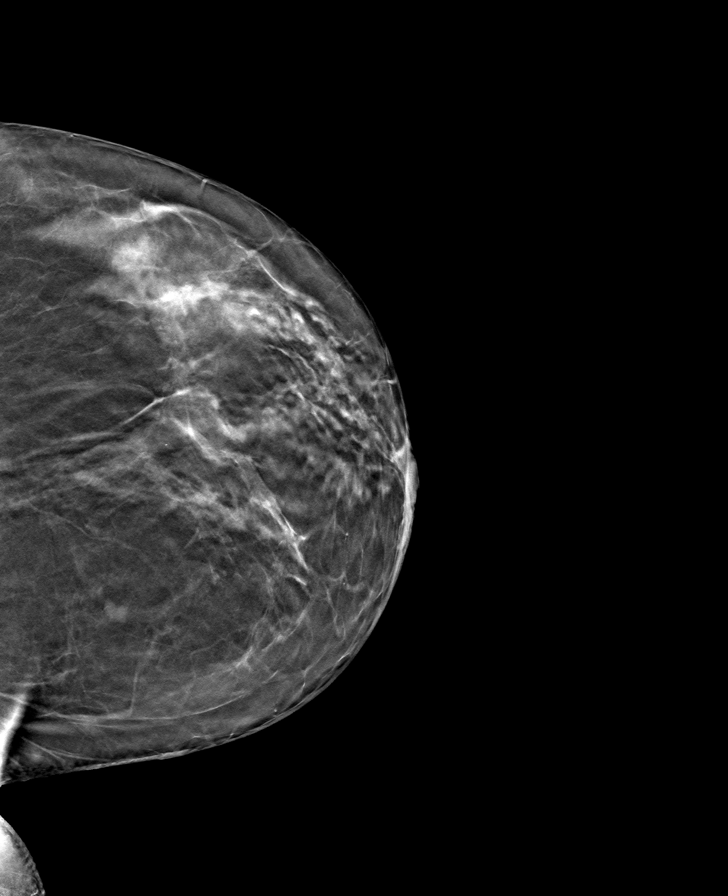

[8 of 24 positions shown; findings below may reference images not displayed]

ACR Breast Density Category c: The breast tissue is heterogeneously
dense, which may obscure small masses.
FINDINGS: There are no findings suspicious for malignancy.
IMPRESSION: No mammographic evidence of malignancy. A result letter of this
screening mammogram will be mailed directly to the patient.

RECOMMENDATION:
Screening mammogram in one year. (Code:Q3-W-BC3)

BI-RADS CATEGORY  1: Negative.

## 2023-06-30 ENCOUNTER — Ambulatory Visit: Payer: Medicare Other | Admitting: Podiatry

## 2023-06-30 ENCOUNTER — Encounter: Payer: Self-pay | Admitting: Podiatry

## 2023-06-30 DIAGNOSIS — M79675 Pain in left toe(s): Secondary | ICD-10-CM

## 2023-06-30 DIAGNOSIS — B351 Tinea unguium: Secondary | ICD-10-CM

## 2023-06-30 DIAGNOSIS — M79674 Pain in right toe(s): Secondary | ICD-10-CM

## 2023-07-05 ENCOUNTER — Encounter: Payer: Self-pay | Admitting: Podiatry

## 2023-07-05 NOTE — Progress Notes (Signed)
  Subjective:  Patient ID: Amber Goodwin, female    DOB: Apr 02, 1946,  MRN: [id number]  77 y.o. female presents to clinic with  painful thick toenails that are difficult to trim. Pain interferes with ambulation. Aggravating factors include wearing enclosed shoe gear. Pain is relieved with periodic professional debridement.  Chief Complaint  Patient presents with   Nail Problem    RFC, Pt is not a diabetic, last office visit was in March PCP is Dr Hyacinth Meeker.     New problem(s): None   PCP is Sigmund Hazel, MD.  Allergies  Allergen Reactions   Lisinopril Cough    Other reaction(s): Cough    Review of Systems: Negative except as noted in the HPI.   Objective:  Amber Goodwin is a pleasant 77 y.o. female in NAD.Marland Kitchen  Vascular Examination: Vascular status intact b/l with palpable pedal pulses. CFT immediate b/l. No edema. No pain with calf compression b/l. Skin temperature gradient WNL b/l. No cyanosis or clubbing noted b/l LE.  Neurological Examination: Sensation grossly intact b/l with 10 gram monofilament. Vibratory sensation intact b/l.   Dermatological Examination: Pedal skin with normal turgor, texture and tone b/l. Toenails 1-5 b/l thick, discolored, elongated with subungual debris and pain on dorsal palpation. No hyperkeratotic lesions noted b/l. No open wounds b/l LE. No interdigital macerations noted b/l LE.  Musculoskeletal Examination: Muscle strength 5/5 to b/l LE. No pain, crepitus or joint limitation noted with ROM bilateral LE. No gross bony deformities bilaterally.  Radiographs: None  Last A1c:       No data to display         Assessment:   1. Pain due to onychomycosis of toenails of both feet    Plan:  -Consent given for treatment as described below: -Examined patient. -Continue supportive shoe gear daily. -Mycotic toenails 1-5 bilaterally were debrided in length and girth with sterile nail nippers and dremel without incident. -Patient/POA to call should  there be question/concern in the interim.  Return in about 9 weeks (around 09/01/2023).  Freddie Breech, DPM

## 2023-08-01 DIAGNOSIS — S0101XA Laceration without foreign body of scalp, initial encounter: Secondary | ICD-10-CM | POA: Diagnosis not present

## 2023-08-01 DIAGNOSIS — S0990XA Unspecified injury of head, initial encounter: Secondary | ICD-10-CM | POA: Diagnosis not present

## 2023-08-01 DIAGNOSIS — S0003XA Contusion of scalp, initial encounter: Secondary | ICD-10-CM | POA: Diagnosis not present

## 2023-08-01 DIAGNOSIS — M542 Cervicalgia: Secondary | ICD-10-CM | POA: Diagnosis not present

## 2023-08-01 DIAGNOSIS — S0181XA Laceration without foreign body of other part of head, initial encounter: Secondary | ICD-10-CM | POA: Diagnosis not present

## 2023-08-01 DIAGNOSIS — R519 Headache, unspecified: Secondary | ICD-10-CM | POA: Diagnosis not present

## 2023-08-01 DIAGNOSIS — S199XXA Unspecified injury of neck, initial encounter: Secondary | ICD-10-CM | POA: Diagnosis not present

## 2023-08-10 DIAGNOSIS — H524 Presbyopia: Secondary | ICD-10-CM | POA: Diagnosis not present

## 2023-08-10 DIAGNOSIS — H43813 Vitreous degeneration, bilateral: Secondary | ICD-10-CM | POA: Diagnosis not present

## 2023-08-10 DIAGNOSIS — H04123 Dry eye syndrome of bilateral lacrimal glands: Secondary | ICD-10-CM | POA: Diagnosis not present

## 2023-08-10 DIAGNOSIS — H52203 Unspecified astigmatism, bilateral: Secondary | ICD-10-CM | POA: Diagnosis not present

## 2023-08-11 DIAGNOSIS — S0101XD Laceration without foreign body of scalp, subsequent encounter: Secondary | ICD-10-CM | POA: Diagnosis not present

## 2023-08-11 DIAGNOSIS — Z4802 Encounter for removal of sutures: Secondary | ICD-10-CM | POA: Diagnosis not present

## 2023-08-11 DIAGNOSIS — Z9181 History of falling: Secondary | ICD-10-CM | POA: Diagnosis not present

## 2023-08-14 ENCOUNTER — Other Ambulatory Visit: Payer: Self-pay | Admitting: Family Medicine

## 2023-08-14 DIAGNOSIS — Z1231 Encounter for screening mammogram for malignant neoplasm of breast: Secondary | ICD-10-CM

## 2023-08-17 ENCOUNTER — Ambulatory Visit (INDEPENDENT_AMBULATORY_CARE_PROVIDER_SITE_OTHER): Payer: Medicare Other | Admitting: Podiatry

## 2023-08-17 ENCOUNTER — Encounter: Payer: Self-pay | Admitting: Podiatry

## 2023-08-17 DIAGNOSIS — Q72899 Other reduction defects of unspecified lower limb: Secondary | ICD-10-CM

## 2023-08-22 NOTE — Progress Notes (Signed)
  Subjective:  Patient ID: Amber Goodwin, female    DOB: 10-29-1945,  MRN: [id number]  Amber Goodwin presents to clinic today for follow up     Chief Complaint  Patient presents with   Nail Problem    Pt is here for [facility], not a diabetic PCP is Dr Hyacinth Meeker and LOV was in March.    PCP is Sigmund Hazel, MD.  Allergies  Allergen Reactions   Lisinopril Cough    Other reaction(s): Cough    Review of Systems: Negative except as noted in the HPI.  Objective: No changes noted in today's physical examination. There were no vitals filed for this visit. Amber Goodwin is a pleasant 77 y.o. female in NAD. AAO x 3.  Vascular Examination: Vascular status intact b/l with palpable pedal pulses. CFT immediate b/l. No edema. No pain with calf compression b/l. Skin temperature gradient WNL b/l. No cyanosis or clubbing noted b/l LE.  Neurological Examination: Sensation grossly intact b/l with 10 gram monofilament. Vibratory sensation intact b/l.   Dermatological Examination: Pedal skin with normal turgor, texture and tone b/l. Toenails 1-5 b/l thick, discolored, elongated with subungual debris and pain on dorsal palpation. No hyperkeratotic lesions noted b/l. No open wounds b/l LE. No interdigital macerations noted b/l LE.  Musculoskeletal Examination: Muscle strength 5/5 to b/l LE. No pain, crepitus or joint limitation noted with ROM bilateral LE. Brachymetatarsia right 4th metatarsal with short 4th toe.  Radiographs: None  Last A1c:       No data to display         Assessment/Plan: 1. Brachymetatarsia of fourth metatarsal bone     -Consent given for treatment as described below: -Examined patient. -Discussed brachymetatarsia left foot using foot bone model, etiology and prognosis. Asymptomatic. Monitor. Toenails debrided as a courtesy. -Patient/POA to call should there be question/concern in the interim.   Return in about 9 weeks (around 10/19/2023).  Freddie Breech, DPM       [place] LOCATION: [address] [place] [address]                   Office [phone]   [facility] LOCATION: [address] [place], [place] [address] Office [phone]

## 2023-09-08 ENCOUNTER — Ambulatory Visit: Payer: Medicare Other | Admitting: Podiatry

## 2023-10-19 ENCOUNTER — Ambulatory Visit
Admission: RE | Admit: 2023-10-19 | Discharge: 2023-10-19 | Disposition: A | Discharge status: Home / Self Care | Payer: Medicare Other | Source: Ambulatory Visit | Attending: Family Medicine | Admitting: Family Medicine

## 2023-10-19 DIAGNOSIS — Z1231 Encounter for screening mammogram for malignant neoplasm of breast: Secondary | ICD-10-CM | POA: Diagnosis not present

## 2023-11-03 DIAGNOSIS — E785 Hyperlipidemia, unspecified: Secondary | ICD-10-CM | POA: Diagnosis not present

## 2023-11-03 DIAGNOSIS — I10 Essential (primary) hypertension: Secondary | ICD-10-CM | POA: Diagnosis not present

## 2023-11-10 ENCOUNTER — Encounter: Payer: Self-pay | Admitting: Podiatry

## 2023-11-10 ENCOUNTER — Ambulatory Visit: Payer: Medicare Other | Admitting: Podiatry

## 2023-11-10 VITALS — Ht 62.0 in | Wt 217.0 lb

## 2023-11-10 DIAGNOSIS — M79674 Pain in right toe(s): Secondary | ICD-10-CM | POA: Diagnosis not present

## 2023-11-10 DIAGNOSIS — M79675 Pain in left toe(s): Secondary | ICD-10-CM | POA: Diagnosis not present

## 2023-11-10 DIAGNOSIS — B351 Tinea unguium: Secondary | ICD-10-CM | POA: Diagnosis not present

## 2023-11-15 NOTE — Progress Notes (Signed)
  Subjective:  Patient ID: Amber Goodwin, female    DOB: 11-May-1946,  MRN: [id number]  78 y.o. female presents to clinic with  painful, elongated thickened toenails x 10 which are symptomatic when wearing enclosed shoe gear. This interferes with his/her daily activities.  Chief Complaint  Patient presents with   rfc    She is here for nail trim, PCP is [facility] and seen once a year.     New problem(s): None   PCP is Sigmund Hazel, MD. Theron Arista 11/03/2023.  Allergies  Allergen Reactions   Lisinopril Cough    Other reaction(s): Cough    Review of Systems: Negative except as noted in the HPI.   Objective:  Amber Goodwin is a pleasant 78 y.o. female in NAD. AAO x 3.  Vascular Examination: Vascular status intact b/l with palpable pedal pulses. CFT immediate b/l. No edema. No pain with calf compression b/l. Skin temperature gradient WNL b/l. Pedal hair absent.  Neurological Examination: Sensation grossly intact b/l with 10 gram monofilament. Vibratory sensation intact b/l.   Dermatological Examination: Pedal skin with normal turgor, texture and tone b/l. No open wounds b/l LE. No interdigital macerations noted b/l LE.Toenails 1-5 b/l thick, discolored, elongated with subungual debris and pain on dorsal palpation. No hyperkeratotic lesions noted b/l.   Musculoskeletal Examination: Muscle strength 5/5 to b/l LE. No pain, crepitus or joint limitation noted with ROM bilateral LE. No gross bony deformities bilaterally.  Radiographs: None  Last A1c:       No data to display           Assessment:   1. Pain due to onychomycosis of toenails of both feet    Plan:  Patient was evaluated and treated. All patient's and/or POA's questions/concerns addressed on today's visit. Toenails 1-5 debrided in length and girth without incident. Continue soft, supportive shoe gear daily. Report any pedal injuries to medical professional. Call office if there are any  questions/concerns. -Patient/POA to call should there be question/concern in the interim.  Return in about 9 weeks (around 01/12/2024).  Freddie Breech, DPM      [place] LOCATION: [address] [place] [address]                   Office [phone]   [facility] LOCATION: [address] [place], [place] [address] Office [phone]

## 2024-01-12 ENCOUNTER — Ambulatory Visit: Payer: Medicare Other | Admitting: Podiatry

## 2024-01-12 ENCOUNTER — Encounter: Payer: Self-pay | Admitting: Podiatry

## 2024-01-12 VITALS — Ht 62.0 in | Wt 217.0 lb

## 2024-01-12 DIAGNOSIS — B351 Tinea unguium: Secondary | ICD-10-CM

## 2024-01-12 DIAGNOSIS — M79674 Pain in right toe(s): Secondary | ICD-10-CM | POA: Diagnosis not present

## 2024-01-12 DIAGNOSIS — M79675 Pain in left toe(s): Secondary | ICD-10-CM

## 2024-01-14 NOTE — Progress Notes (Signed)
  Subjective:  Patient ID: Amber Goodwin, female    DOB: December 28, 1945,  MRN: [id number]  78 y.o. female presents to clinic with  painful, discolored, thick toenails which interfere with daily activities  Chief Complaint  Patient presents with   Nail Problem    Pt is here for [facility] PCP is Dr Annabell Key and LOV was in March.   New problem(s): None   PCP is Perley Bradley, MD.  Allergies  Allergen Reactions   Lisinopril Cough    Other reaction(s): Cough   Review of Systems: Negative except as noted in the HPI.   Objective:  Amber Goodwin is a pleasant 78 y.o. female in NAD. AAO x 3.  Vascular Examination: Vascular status intact b/l with palpable pedal pulses. CFT immediate b/l. No edema. No pain with calf compression b/l. Skin temperature gradient WNL b/l. Pedal hair absent.  Neurological Examination: Sensation grossly intact b/l with 10 gram monofilament.   Dermatological Examination: Pedal skin with normal turgor, texture and tone b/l. Toenails 1-5 b/l thick, discolored, elongated with subungual debris and pain on dorsal palpation. No corns, calluses nor porokeratotic lesions noted.  Musculoskeletal Examination: Muscle strength 5/5 to b/l LE. No pain, crepitus or joint limitation noted with ROM bilateral LE. No gross bony deformities bilaterally.  Radiographs: None  Last A1c:       No data to display           Assessment:   1. Pain due to onychomycosis of toenails of both feet     Plan:  Consent given for treatment. Patient examined. All patient's and/or POA's questions/concerns addressed on today's visit.Toenails 1-5 debrided in length and girth without incident. Continue soft, supportive shoe gear daily. Report any pedal injuries to medical professional. Call office if there are any questions/concerns. -Patient/POA to call should there be question/concern in the interim.  Return in about 3 months (around 04/13/2024).  Luella Sager, DPM      [place]  LOCATION: [address] [place] [address]                   Office [phone]   [facility] LOCATION: [address] [place], [place] [address] Office [phone]

## 2024-02-01 DIAGNOSIS — L821 Other seborrheic keratosis: Secondary | ICD-10-CM | POA: Diagnosis not present

## 2024-02-01 DIAGNOSIS — L814 Other melanin hyperpigmentation: Secondary | ICD-10-CM | POA: Diagnosis not present

## 2024-02-01 DIAGNOSIS — D1801 Hemangioma of skin and subcutaneous tissue: Secondary | ICD-10-CM | POA: Diagnosis not present

## 2024-02-01 DIAGNOSIS — L720 Epidermal cyst: Secondary | ICD-10-CM | POA: Diagnosis not present

## 2024-02-01 DIAGNOSIS — L218 Other seborrheic dermatitis: Secondary | ICD-10-CM | POA: Diagnosis not present

## 2024-02-01 DIAGNOSIS — L918 Other hypertrophic disorders of the skin: Secondary | ICD-10-CM | POA: Diagnosis not present

## 2024-02-01 DIAGNOSIS — L570 Actinic keratosis: Secondary | ICD-10-CM | POA: Diagnosis not present

## 2024-03-17 ENCOUNTER — Ambulatory Visit (INDEPENDENT_AMBULATORY_CARE_PROVIDER_SITE_OTHER): Payer: Medicare Other | Admitting: Podiatry

## 2024-03-17 DIAGNOSIS — M79674 Pain in right toe(s): Secondary | ICD-10-CM | POA: Diagnosis not present

## 2024-03-17 DIAGNOSIS — M79675 Pain in left toe(s): Secondary | ICD-10-CM | POA: Diagnosis not present

## 2024-03-17 DIAGNOSIS — B351 Tinea unguium: Secondary | ICD-10-CM | POA: Diagnosis not present

## 2024-03-22 ENCOUNTER — Encounter: Payer: Self-pay | Admitting: Podiatry

## 2024-03-22 NOTE — Progress Notes (Signed)
  Subjective:  Patient ID: Amber Goodwin, female    DOB: [birth year],  MRN: [id number]  78 y.o. female presents to clinic with  painful thick toenails that are difficult to trim. Pain interferes with ambulation. Aggravating factors include wearing enclosed shoe gear. Pain is relieved with periodic professional debridement.  Chief Complaint  Patient presents with   Nail Problem    Thick painful toenails, 9 week follow up     New problem(s): None   PCP is Cleotilde Planas, MD.  Allergies  Allergen Reactions   Lisinopril Cough    Other reaction(s): Cough    Review of Systems: Negative except as noted in the HPI.   Objective:  Amber Goodwin is a pleasant 78 y.o. female in NAD. AAO x 3.  Vascular Examination: Vascular status intact b/l with palpable pedal pulses. CFT immediate b/l. No edema. No pain with calf compression b/l. Skin temperature gradient WNL b/l. No ischemia or gangrene noted b/l LE. No cyanosis or clubbing noted b/l LE.  Neurological Examination: Sensation grossly intact b/l with 10 gram monofilament. Vibratory sensation intact b/l.   Dermatological Examination: Pedal skin with normal turgor, texture and tone b/l. Toenails 1-5 b/l thick, discolored, elongated with subungual debris and pain on dorsal palpation. No hyperkeratotic lesions noted b/l.   Musculoskeletal Examination: Muscle strength 5/5 to b/l LE. Brachymetatarsia 4th metatarsal right foot.  Radiographs: None Assessment:   1. Pain due to onychomycosis of toenails of both feet    Plan:  Patient was evaluated and treated. All patient's and/or POA's questions/concerns addressed on today's visit. Toenails 1-5 debrided in length and girth without incident. Continue soft, supportive shoe gear daily. Report any pedal injuries to medical professional. Call office if there are any questions/concerns.  Return in about 9 weeks (around 05/19/2024).  Delon LITTIE Merlin, DPM      [place] LOCATION: [address] [place] [address]                   Office [phone]   [facility] LOCATION: [address] [place], [place] [address] Office [phone]

## 2024-05-24 ENCOUNTER — Ambulatory Visit: Admitting: Podiatry

## 2024-05-24 VITALS — Ht 62.0 in | Wt 217.0 lb

## 2024-05-24 DIAGNOSIS — M79675 Pain in left toe(s): Secondary | ICD-10-CM | POA: Diagnosis not present

## 2024-05-24 DIAGNOSIS — M79674 Pain in right toe(s): Secondary | ICD-10-CM

## 2024-05-24 DIAGNOSIS — B351 Tinea unguium: Secondary | ICD-10-CM

## 2024-05-25 ENCOUNTER — Encounter: Payer: Self-pay | Admitting: Podiatry

## 2024-05-25 NOTE — Progress Notes (Signed)
  Subjective:  Patient ID: Amber Goodwin, female    DOB: August 14, 1946,  MRN: [id number]  Amber Goodwin presents to clinic today for: painful mycotic toenails of both feet that are difficult to trim. Pain interferes with daily activities and wearing enclosed shoe gear comfortably.  Chief Complaint  Patient presents with   Nail Problem    Rm 17 RFC/ Pt is not diabetic. PCP-Dr. Olam Offer, last vsit 10/2023    PCP is Cleotilde Olam, MD.  Allergies  Allergen Reactions   Lisinopril Cough    Other reaction(s): Cough    Review of Systems: Negative except as noted in the HPI.  Objective: No changes noted in today's physical examination. There were no vitals filed for this visit.  Amber Goodwin is a pleasant 78 y.o. female in NAD. AAO x 3.  Vascular Examination: Capillary refill time <3 seconds b/l LE. Palpable pedal pulses b/l LE. Digital hair present b/l. No pedal edema b/l. Skin temperature gradient WNL b/l. No varicosities b/l. No cyanosis or clubbing. No ischemia or gangrene. .  Dermatological Examination: Pedal skin with normal turgor, texture and tone b/l. No open wounds. No interdigital macerations b/l. Toenails 1-5 b/l thickened, discolored, dystrophic with subungual debris. There is pain on palpation to dorsal aspect of nailplates. No hyperkeratotic nor porokeratotic lesions. No corns, calluses, nor porokeratotic lesions.  Neurological Examination: Protective sensation intact with 10 gram monofilament b/l LE. Vibratory sensation intact b/l LE.   Musculoskeletal Examination: Muscle strength 5/5 to all lower extremity muscle groups bilaterally. Brachymetatarsia 4th metatarsal b/l.SABRA No pain, crepitus or joint limitation noted with ROM b/l LE.  Patient ambulates independently without assistive aids.  Assessment/Plan: 1. Pain due to onychomycosis of toenails of both feet   -Consent given for treatment as described below: -Examined patient. -Patient to continue soft, supportive  shoe gear daily. -Mycotic toenails 1-5 bilaterally were debrided in length and girth with sterile nail nippers and dremel. Pinpoint bleeding of left great toe addressed with Lumicain Hemostatic Solution, cleansed with alcohol. Triple antibiotic ointment applied. Patient/caregiver instructed to apply Neosporin Cream once daily for 7 days. -Patient/POA to call should there be question/concern in the interim.   No follow-ups on file.  Delon LITTIE Merlin, DPM      [place] LOCATION: [address] [place] [address]                   Office [phone]   [facility] LOCATION: [address] [place], [place] [address] Office [phone]

## 2024-06-20 DIAGNOSIS — R197 Diarrhea, unspecified: Secondary | ICD-10-CM | POA: Diagnosis not present

## 2024-08-02 ENCOUNTER — Ambulatory Visit: Admitting: Podiatry

## 2024-08-02 ENCOUNTER — Encounter: Payer: Self-pay | Admitting: Podiatry

## 2024-08-02 DIAGNOSIS — B351 Tinea unguium: Secondary | ICD-10-CM | POA: Diagnosis not present

## 2024-08-02 DIAGNOSIS — M79674 Pain in right toe(s): Secondary | ICD-10-CM | POA: Diagnosis not present

## 2024-08-02 DIAGNOSIS — M79675 Pain in left toe(s): Secondary | ICD-10-CM

## 2024-08-07 NOTE — Progress Notes (Signed)
  Subjective:  Patient ID: Amber Goodwin, female    DOB: [birth year],  MRN: [id number]  Amber Goodwin presents to clinic today for painful thick toenails that are difficult to trim. Pain interferes with ambulation. Aggravating factors include wearing enclosed shoe gear. Pain is relieved with periodic professional debridement.  Chief Complaint  Patient presents with   Nail Problem    Thick painful toenails, 9 week  follow up    New problem(s): None.   PCP is Cleotilde Planas, MD. Amber Goodwin 11/03/23.  Allergies  Allergen Reactions   Lisinopril Cough    Other reaction(s): Cough    Review of Systems: Negative except as noted in the HPI.  Objective: No changes noted in today's physical examination. There were no vitals filed for this visit. Amber Goodwin is a pleasant 78 y.o. female in NAD. AAO x 3.  Vascular Examination: Capillary refill time immediate b/l. Palpable pedal pulses. Pedal hair present b/l. Pedal edema absent. No pain with calf compression b/l. Skin temperature gradient WNL b/l. No cyanosis or clubbing b/l. No ischemia or gangrene noted b/l.   Neurological Examination: Sensation grossly intact b/l with 10 gram monofilament. Vibratory sensation intact b/l.   Dermatological Examination: Pedal skin with normal turgor, texture and tone b/l.  No open wounds. No interdigital macerations.   Toenails 1-5 b/l thick, discolored, elongated with subungual debris and pain on dorsal palpation.   No hyperkeratotic nor porokeratotic lesions.  Musculoskeletal Examination: Muscle strength 5/5 to all lower extremity muscle groups bilaterally. Brachymetatarsia 4th metatarsal b/l.SABRA No pain, crepitus or joint limitation noted with ROM b/l LE.  Patient ambulates independently without assistive aids.  Radiographs: None  Assessment/Plan: 1. Pain due to onychomycosis of toenails of both feet   Patient was evaluated and treated. All patient's and/or POA's questions/concerns addressed on  today's visit. Toenails 1-5 b/l debrided in length and girth without incident. Continue soft, supportive shoe gear daily. Report any pedal injuries to medical professional. Call office if there are any questions/concerns. -Patient/POA to call should there be question/concern in the interim.   Return in about 9 weeks (around 10/04/2024).  Amber Goodwin Merlin, DPM      [place] LOCATION: [address] [place] [address]                   Office [phone]   [facility] LOCATION: [address] [place], [place] [address] Office [phone]

## 2024-09-20 ENCOUNTER — Other Ambulatory Visit: Payer: Self-pay | Admitting: Family Medicine

## 2024-09-20 DIAGNOSIS — Z1231 Encounter for screening mammogram for malignant neoplasm of breast: Secondary | ICD-10-CM

## 2024-10-04 ENCOUNTER — Encounter: Payer: Self-pay | Admitting: Podiatry

## 2024-10-04 ENCOUNTER — Ambulatory Visit: Admitting: Podiatry

## 2024-10-04 DIAGNOSIS — B351 Tinea unguium: Secondary | ICD-10-CM

## 2024-10-04 NOTE — Progress Notes (Signed)
"  °  Subjective:  Patient ID: Amber Goodwin, female    DOB: 16-Sep-1945,  MRN: [id number]  Amber Goodwin presents to clinic today for painful elongated mycotic toenails 1-5 bilaterally which are tender when wearing enclosed shoe gear. Pain is relieved with periodic professional debridement.  Chief Complaint  Patient presents with   Nail Problem    Toenails   New problem(s): None.   PCP is Cleotilde Planas, MD.  Allergies[1]  Review of Systems: Negative except as noted in the HPI.  Objective: No changes noted in today's physical examination. There were no vitals filed for this visit. Amber Goodwin is a pleasant 79 y.o. female in NAD. AAO x 3.  Vascular Examination: Capillary refill time immediate b/l. Palpable pedal pulses. Pedal hair present b/l. Pedal edema absent. No pain with calf compression b/l. Skin temperature gradient WNL b/l. No cyanosis or clubbing b/l. No ischemia or gangrene noted b/l.   Neurological Examination: Sensation grossly intact b/l with 10 gram monofilament. Vibratory sensation intact b/l.   Dermatological Examination: Pedal skin with normal turgor, texture and tone b/l.  No open wounds. No interdigital macerations.   Toenails 1-5 b/l thick, discolored, elongated with subungual debris and pain on dorsal palpation.   No hyperkeratotic nor porokeratotic lesions.  Musculoskeletal Examination: Muscle strength 5/5 to all lower extremity muscle groups bilaterally. Brachymetatarsia 4th metatarsal b/l.SABRA No pain, crepitus or joint limitation noted with ROM b/l LE.  Patient ambulates independently without assistive aids.  Radiographs: None  Assessment/Plan: 1. Pain due to onychomycosis of toenails of both feet     Consent given for treatment. Patient examined. All patient's and/or POA's questions/concerns addressed on today's visit. Mycotic toenails 1-5 b/l debrided in length and girth without incident. Continue soft, supportive shoe gear daily. Report any pedal  injuries to medical professional. Call office if there are any quesitons/concerns. -Patient/POA to call should there be question/concern in the interim.   Return in about 9 weeks (around 12/06/2024).  Delon LITTIE Merlin, DPM      [place] LOCATION: [address] [place] [address]                   Office [phone]   [place] LOCATION: [address] [place], [place] [address] Office [phone]      [1]  Allergies Allergen Reactions   Lisinopril Cough    Other reaction(s): Cough   "

## 2024-10-20 ENCOUNTER — Ambulatory Visit

## 2024-10-20 DIAGNOSIS — Z1231 Encounter for screening mammogram for malignant neoplasm of breast: Secondary | ICD-10-CM

## 2024-12-06 ENCOUNTER — Ambulatory Visit: Admitting: Podiatry

## 2024-12-13 ENCOUNTER — Ambulatory Visit: Admitting: Podiatry

## 2025-02-07 ENCOUNTER — Ambulatory Visit: Admitting: Podiatry

## 2025-02-14 ENCOUNTER — Ambulatory Visit: Admitting: Podiatry

## 2025-04-11 ENCOUNTER — Ambulatory Visit: Admitting: Podiatry

## 2025-04-18 ENCOUNTER — Ambulatory Visit: Admitting: Podiatry
